# Patient Record
Sex: Male | Born: 1945 | Race: White | Hispanic: No | Marital: Married | Smoking: Former smoker
Health system: Southern US, Community
[De-identification: ages and names within clinical notes are randomized; demographics above are authoritative.]

## PROBLEM LIST (undated history)

## (undated) DIAGNOSIS — I509 Heart failure, unspecified: Secondary | ICD-10-CM

## (undated) DIAGNOSIS — E119 Type 2 diabetes mellitus without complications: Secondary | ICD-10-CM

## (undated) DIAGNOSIS — I639 Cerebral infarction, unspecified: Secondary | ICD-10-CM

## (undated) DIAGNOSIS — D509 Iron deficiency anemia, unspecified: Secondary | ICD-10-CM

## (undated) DIAGNOSIS — J439 Emphysema, unspecified: Secondary | ICD-10-CM

## (undated) DIAGNOSIS — I2510 Atherosclerotic heart disease of native coronary artery without angina pectoris: Secondary | ICD-10-CM

## (undated) DIAGNOSIS — I219 Acute myocardial infarction, unspecified: Secondary | ICD-10-CM

## (undated) DIAGNOSIS — I4891 Unspecified atrial fibrillation: Secondary | ICD-10-CM

## (undated) DIAGNOSIS — K922 Gastrointestinal hemorrhage, unspecified: Secondary | ICD-10-CM

## (undated) DIAGNOSIS — K31811 Angiodysplasia of stomach and duodenum with bleeding: Secondary | ICD-10-CM

## (undated) DIAGNOSIS — K5520 Angiodysplasia of colon without hemorrhage: Secondary | ICD-10-CM

## (undated) HISTORY — PX: COLONOSCOPY W/ POLYPECTOMY: SHX1380

## (undated) HISTORY — PX: PACEMAKER IMPLANT: EP1218

## (undated) HISTORY — PX: OTHER SURGICAL HISTORY: SHX169

## (undated) HISTORY — PX: CARDIAC CATHETERIZATION: SHX172

## (undated) HISTORY — PX: CORONARY STENT PLACEMENT: SHX1402

## (undated) HISTORY — PX: ANGIOPLASTY: SHX39

---

## 2002-03-18 ENCOUNTER — Encounter: Payer: Self-pay | Admitting: Urology

## 2002-03-18 ENCOUNTER — Encounter: Admission: RE | Admit: 2002-03-18 | Discharge: 2002-03-18 | Payer: Self-pay | Admitting: Urology

## 2017-12-11 DIAGNOSIS — J440 Chronic obstructive pulmonary disease with acute lower respiratory infection: Secondary | ICD-10-CM | POA: Diagnosis not present

## 2017-12-11 DIAGNOSIS — J9622 Acute and chronic respiratory failure with hypercapnia: Secondary | ICD-10-CM | POA: Diagnosis not present

## 2017-12-11 DIAGNOSIS — J441 Chronic obstructive pulmonary disease with (acute) exacerbation: Secondary | ICD-10-CM | POA: Diagnosis not present

## 2017-12-11 DIAGNOSIS — J9621 Acute and chronic respiratory failure with hypoxia: Secondary | ICD-10-CM

## 2017-12-11 DIAGNOSIS — E872 Acidosis: Secondary | ICD-10-CM | POA: Diagnosis not present

## 2017-12-11 DIAGNOSIS — A419 Sepsis, unspecified organism: Secondary | ICD-10-CM

## 2017-12-11 DIAGNOSIS — J189 Pneumonia, unspecified organism: Secondary | ICD-10-CM | POA: Diagnosis not present

## 2017-12-12 DIAGNOSIS — R0602 Shortness of breath: Secondary | ICD-10-CM

## 2017-12-12 DIAGNOSIS — J189 Pneumonia, unspecified organism: Secondary | ICD-10-CM | POA: Diagnosis not present

## 2017-12-12 DIAGNOSIS — J441 Chronic obstructive pulmonary disease with (acute) exacerbation: Secondary | ICD-10-CM | POA: Diagnosis not present

## 2017-12-12 DIAGNOSIS — E872 Acidosis: Secondary | ICD-10-CM | POA: Diagnosis not present

## 2017-12-12 DIAGNOSIS — J9621 Acute and chronic respiratory failure with hypoxia: Secondary | ICD-10-CM | POA: Diagnosis not present

## 2017-12-12 DIAGNOSIS — J9622 Acute and chronic respiratory failure with hypercapnia: Secondary | ICD-10-CM | POA: Diagnosis not present

## 2017-12-12 DIAGNOSIS — A419 Sepsis, unspecified organism: Secondary | ICD-10-CM | POA: Diagnosis not present

## 2017-12-13 DIAGNOSIS — J9622 Acute and chronic respiratory failure with hypercapnia: Secondary | ICD-10-CM | POA: Diagnosis not present

## 2017-12-13 DIAGNOSIS — J9621 Acute and chronic respiratory failure with hypoxia: Secondary | ICD-10-CM | POA: Diagnosis not present

## 2017-12-13 DIAGNOSIS — J189 Pneumonia, unspecified organism: Secondary | ICD-10-CM | POA: Diagnosis not present

## 2017-12-13 DIAGNOSIS — A419 Sepsis, unspecified organism: Secondary | ICD-10-CM | POA: Diagnosis not present

## 2017-12-14 DIAGNOSIS — J9621 Acute and chronic respiratory failure with hypoxia: Secondary | ICD-10-CM | POA: Diagnosis not present

## 2017-12-14 DIAGNOSIS — J9622 Acute and chronic respiratory failure with hypercapnia: Secondary | ICD-10-CM | POA: Diagnosis not present

## 2017-12-14 DIAGNOSIS — J189 Pneumonia, unspecified organism: Secondary | ICD-10-CM | POA: Diagnosis not present

## 2017-12-14 DIAGNOSIS — A419 Sepsis, unspecified organism: Secondary | ICD-10-CM | POA: Diagnosis not present

## 2017-12-14 DIAGNOSIS — E872 Acidosis: Secondary | ICD-10-CM | POA: Diagnosis not present

## 2018-09-19 DIAGNOSIS — I4891 Unspecified atrial fibrillation: Secondary | ICD-10-CM

## 2018-09-19 DIAGNOSIS — R079 Chest pain, unspecified: Secondary | ICD-10-CM | POA: Diagnosis not present

## 2018-09-19 DIAGNOSIS — K219 Gastro-esophageal reflux disease without esophagitis: Secondary | ICD-10-CM

## 2018-09-19 DIAGNOSIS — I10 Essential (primary) hypertension: Secondary | ICD-10-CM | POA: Diagnosis not present

## 2018-09-19 DIAGNOSIS — J449 Chronic obstructive pulmonary disease, unspecified: Secondary | ICD-10-CM | POA: Diagnosis not present

## 2018-09-20 DIAGNOSIS — J449 Chronic obstructive pulmonary disease, unspecified: Secondary | ICD-10-CM | POA: Diagnosis not present

## 2018-09-20 DIAGNOSIS — I4891 Unspecified atrial fibrillation: Secondary | ICD-10-CM | POA: Diagnosis not present

## 2018-09-20 DIAGNOSIS — I10 Essential (primary) hypertension: Secondary | ICD-10-CM | POA: Diagnosis not present

## 2018-09-20 DIAGNOSIS — K219 Gastro-esophageal reflux disease without esophagitis: Secondary | ICD-10-CM | POA: Diagnosis not present

## 2018-09-21 DIAGNOSIS — J449 Chronic obstructive pulmonary disease, unspecified: Secondary | ICD-10-CM | POA: Diagnosis not present

## 2018-09-21 DIAGNOSIS — K219 Gastro-esophageal reflux disease without esophagitis: Secondary | ICD-10-CM | POA: Diagnosis not present

## 2018-09-21 DIAGNOSIS — I4891 Unspecified atrial fibrillation: Secondary | ICD-10-CM | POA: Diagnosis not present

## 2018-09-21 DIAGNOSIS — I10 Essential (primary) hypertension: Secondary | ICD-10-CM | POA: Diagnosis not present

## 2019-08-23 DIAGNOSIS — K5520 Angiodysplasia of colon without hemorrhage: Secondary | ICD-10-CM | POA: Diagnosis not present

## 2019-08-23 DIAGNOSIS — I2510 Atherosclerotic heart disease of native coronary artery without angina pectoris: Secondary | ICD-10-CM | POA: Diagnosis not present

## 2019-08-23 DIAGNOSIS — J449 Chronic obstructive pulmonary disease, unspecified: Secondary | ICD-10-CM

## 2019-08-23 DIAGNOSIS — R079 Chest pain, unspecified: Secondary | ICD-10-CM

## 2019-08-23 DIAGNOSIS — K219 Gastro-esophageal reflux disease without esophagitis: Secondary | ICD-10-CM

## 2019-08-23 DIAGNOSIS — I10 Essential (primary) hypertension: Secondary | ICD-10-CM

## 2019-08-24 DIAGNOSIS — I2510 Atherosclerotic heart disease of native coronary artery without angina pectoris: Secondary | ICD-10-CM | POA: Diagnosis not present

## 2019-08-24 DIAGNOSIS — R079 Chest pain, unspecified: Secondary | ICD-10-CM | POA: Diagnosis not present

## 2019-08-24 DIAGNOSIS — J449 Chronic obstructive pulmonary disease, unspecified: Secondary | ICD-10-CM | POA: Diagnosis not present

## 2019-08-24 DIAGNOSIS — K5520 Angiodysplasia of colon without hemorrhage: Secondary | ICD-10-CM | POA: Diagnosis not present

## 2019-12-29 DIAGNOSIS — R079 Chest pain, unspecified: Secondary | ICD-10-CM

## 2019-12-29 DIAGNOSIS — E1169 Type 2 diabetes mellitus with other specified complication: Secondary | ICD-10-CM

## 2019-12-29 DIAGNOSIS — I10 Essential (primary) hypertension: Secondary | ICD-10-CM

## 2019-12-29 DIAGNOSIS — I361 Nonrheumatic tricuspid (valve) insufficiency: Secondary | ICD-10-CM

## 2019-12-29 DIAGNOSIS — J449 Chronic obstructive pulmonary disease, unspecified: Secondary | ICD-10-CM

## 2019-12-29 DIAGNOSIS — I2510 Atherosclerotic heart disease of native coronary artery without angina pectoris: Secondary | ICD-10-CM

## 2019-12-30 DIAGNOSIS — J449 Chronic obstructive pulmonary disease, unspecified: Secondary | ICD-10-CM | POA: Diagnosis not present

## 2019-12-30 DIAGNOSIS — E1169 Type 2 diabetes mellitus with other specified complication: Secondary | ICD-10-CM | POA: Diagnosis not present

## 2019-12-30 DIAGNOSIS — R079 Chest pain, unspecified: Secondary | ICD-10-CM

## 2019-12-30 DIAGNOSIS — I2510 Atherosclerotic heart disease of native coronary artery without angina pectoris: Secondary | ICD-10-CM | POA: Diagnosis not present

## 2020-01-03 DIAGNOSIS — I2510 Atherosclerotic heart disease of native coronary artery without angina pectoris: Secondary | ICD-10-CM | POA: Diagnosis not present

## 2020-01-03 DIAGNOSIS — I480 Paroxysmal atrial fibrillation: Secondary | ICD-10-CM | POA: Diagnosis not present

## 2020-01-03 DIAGNOSIS — I495 Sick sinus syndrome: Secondary | ICD-10-CM | POA: Diagnosis not present

## 2020-01-03 DIAGNOSIS — Z950 Presence of cardiac pacemaker: Secondary | ICD-10-CM | POA: Diagnosis not present

## 2020-01-03 DIAGNOSIS — I959 Hypotension, unspecified: Secondary | ICD-10-CM | POA: Diagnosis not present

## 2020-01-04 MED ORDER — PHENOL EX
40.00 | CUTANEOUS | Status: DC
Start: 2020-01-04 — End: 2020-01-04

## 2020-01-04 MED ORDER — EQUATE NICOTINE 4 MG MT GUM
4.00 | CHEWING_GUM | OROMUCOSAL | Status: DC
Start: ? — End: 2020-01-04

## 2020-01-04 MED ORDER — FOSPHENYTOIN SODIUM 50 MG PE/ML IJ SOLN
15.00 | INTRAMUSCULAR | Status: DC
Start: ? — End: 2020-01-04

## 2020-01-04 MED ORDER — Medication
0.04 | Status: DC
Start: ? — End: 2020-01-04

## 2020-01-04 MED ORDER — LOSARTAN POTASSIUM (ANGIOTENSIN II RECEPTOR ANTAGONISTS)
20.00 | Status: DC
Start: 2020-01-04 — End: 2020-01-04

## 2020-01-04 MED ORDER — Medication
0.00 | Status: DC
Start: ? — End: 2020-01-04

## 2020-01-04 MED ORDER — HCA GLYCERIN (INFANT) 1.5 G RE SUPP
80.00 | RECTAL | Status: DC
Start: 2020-01-04 — End: 2020-01-04

## 2020-01-04 MED ORDER — Medication
1.00 | Status: DC
Start: 2020-01-04 — End: 2020-01-04

## 2020-01-04 MED ORDER — GENERIC EXTERNAL MEDICATION
500.00 | Status: DC
Start: 2020-01-04 — End: 2020-01-04

## 2020-01-04 MED ORDER — COMPOUND W FREEZE OFF EX AERO
0.00 | INHALATION_SPRAY | CUTANEOUS | Status: DC
Start: 2020-01-04 — End: 2020-01-04

## 2020-01-04 MED ORDER — IPECAC PO
12.00 | ORAL | Status: DC
Start: ? — End: 2020-01-04

## 2020-01-04 MED ORDER — STRI-DEX MAXIMUM STRENGTH 2 % EX PADS
125.00 | MEDICATED_PAD | CUTANEOUS | Status: DC
Start: ? — End: 2020-01-04

## 2020-01-04 MED ORDER — AMPHOTER B CHOLEST SULF CMPLX
81.00 | Status: DC
Start: 2020-01-06 — End: 2020-01-04

## 2020-01-04 MED ORDER — BUDESONIDE 0.5 MG/2ML IN SUSP
0.50 | RESPIRATORY_TRACT | Status: DC
Start: 2020-01-05 — End: 2020-01-04

## 2020-01-04 MED ORDER — ARMOUR THYROID 30 MG PO TABS
4.00 | ORAL_TABLET | ORAL | Status: DC
Start: 2020-01-04 — End: 2020-01-04

## 2020-01-04 MED ORDER — PANCOF HC 15-2-3 MG/5ML OR SYRP
1.00 | ORAL_SOLUTION | ORAL | Status: DC
Start: ? — End: 2020-01-04

## 2020-01-04 MED ORDER — IPECAC PO
60.00 | ORAL | Status: DC
Start: ? — End: 2020-01-04

## 2020-01-04 MED ORDER — EQL SHEER SPOTS SMALL MISC
0.00 | Status: DC
Start: ? — End: 2020-01-04

## 2020-01-04 MED ORDER — MONTELUKAST SODIUM 10 MG PO TABS
10.00 | ORAL_TABLET | ORAL | Status: DC
Start: 2020-01-09 — End: 2020-01-04

## 2020-01-04 MED ORDER — CALCIUM-VITAMIN D-VITAMIN K 600-1000-90 MG-UNT-MCG PO TABS
25.00 | ORAL_TABLET | ORAL | Status: DC
Start: ? — End: 2020-01-04

## 2020-01-04 MED ORDER — IPECAC PO
30.00 | ORAL | Status: DC
Start: ? — End: 2020-01-04

## 2020-01-05 MED ORDER — PANTOPRAZOLE SODIUM 40 MG PO TBEC
40.00 | DELAYED_RELEASE_TABLET | ORAL | Status: DC
Start: 2020-01-10 — End: 2020-01-05

## 2020-01-05 MED ORDER — HYDRALAZINE HCL 20 MG/ML IJ SOLN
10.00 | INTRAMUSCULAR | Status: DC
Start: ? — End: 2020-01-05

## 2020-01-05 MED ORDER — ACCU-PRO PUMP SET/VENT MISC
2.50 | Status: DC
Start: 2020-01-05 — End: 2020-01-05

## 2020-01-05 MED ORDER — ALBUTEROL SULFATE HFA 108 (90 BASE) MCG/ACT IN AERS
4.00 | INHALATION_SPRAY | RESPIRATORY_TRACT | Status: DC
Start: ? — End: 2020-01-05

## 2020-01-05 MED ORDER — CVS KIDPANT BOYS X-LARGE MISC
80.00 | Status: DC
Start: 2020-01-05 — End: 2020-01-05

## 2020-01-10 MED ORDER — UMECLIDINIUM BROMIDE 62.5 MCG/INH IN AEPB
1.00 | INHALATION_SPRAY | RESPIRATORY_TRACT | Status: DC
Start: 2020-01-10 — End: 2020-01-10

## 2020-01-10 MED ORDER — FLUTICASONE FUROATE-VILANTEROL 200-25 MCG/INH IN AEPB
1.00 | INHALATION_SPRAY | RESPIRATORY_TRACT | Status: DC
Start: 2020-01-10 — End: 2020-01-10

## 2020-01-10 MED ORDER — PREDNISONE 20 MG PO TABS
40.00 | ORAL_TABLET | ORAL | Status: DC
Start: 2020-01-10 — End: 2020-01-10

## 2020-01-10 MED ORDER — GUAIFENESIN ER 600 MG PO TB12
600.00 | ORAL_TABLET | ORAL | Status: DC
Start: 2020-01-09 — End: 2020-01-10

## 2020-01-10 MED ORDER — HEPARIN SODIUM (PORCINE) 5000 UNIT/ML IJ SOLN
5000.00 | INTRAMUSCULAR | Status: DC
Start: 2020-01-09 — End: 2020-01-10

## 2020-01-10 MED ORDER — LISINOPRIL 5 MG PO TABS
2.50 | ORAL_TABLET | ORAL | Status: DC
Start: 2020-01-10 — End: 2020-01-10

## 2020-01-10 MED ORDER — ACETAMINOPHEN 325 MG PO TABS
650.00 | ORAL_TABLET | ORAL | Status: DC
Start: ? — End: 2020-01-10

## 2020-01-10 MED ORDER — ALBUTEROL SULFATE (2.5 MG/3ML) 0.083% IN NEBU
2.50 | INHALATION_SOLUTION | RESPIRATORY_TRACT | Status: DC
Start: ? — End: 2020-01-10

## 2020-01-10 MED ORDER — INSULIN LISPRO 100 UNIT/ML ~~LOC~~ SOLN
0.00 | SUBCUTANEOUS | Status: DC
Start: 2020-01-09 — End: 2020-01-10

## 2020-01-10 MED ORDER — METOPROLOL TARTRATE 25 MG PO TABS
25.00 | ORAL_TABLET | ORAL | Status: DC
Start: 2020-01-09 — End: 2020-01-10

## 2020-02-24 ENCOUNTER — Inpatient Hospital Stay (HOSPITAL_COMMUNITY)
Admission: EM | Admit: 2020-02-24 | Discharge: 2020-03-05 | DRG: 377 | Disposition: A | Discharge status: Home Health | Payer: Medicare HMO | Source: Other Acute Inpatient Hospital | Attending: Internal Medicine | Admitting: Internal Medicine

## 2020-02-24 ENCOUNTER — Encounter (HOSPITAL_COMMUNITY): Payer: Self-pay | Admitting: Internal Medicine

## 2020-02-24 DIAGNOSIS — K5520 Angiodysplasia of colon without hemorrhage: Secondary | ICD-10-CM | POA: Diagnosis not present

## 2020-02-24 DIAGNOSIS — J439 Emphysema, unspecified: Secondary | ICD-10-CM | POA: Diagnosis not present

## 2020-02-24 DIAGNOSIS — Z8673 Personal history of transient ischemic attack (TIA), and cerebral infarction without residual deficits: Secondary | ICD-10-CM | POA: Diagnosis not present

## 2020-02-24 DIAGNOSIS — N1831 Chronic kidney disease, stage 3a: Secondary | ICD-10-CM | POA: Diagnosis present

## 2020-02-24 DIAGNOSIS — I2510 Atherosclerotic heart disease of native coronary artery without angina pectoris: Secondary | ICD-10-CM | POA: Diagnosis present

## 2020-02-24 DIAGNOSIS — Z9911 Dependence on respirator [ventilator] status: Secondary | ICD-10-CM | POA: Diagnosis not present

## 2020-02-24 DIAGNOSIS — Z823 Family history of stroke: Secondary | ICD-10-CM

## 2020-02-24 DIAGNOSIS — Z9981 Dependence on supplemental oxygen: Secondary | ICD-10-CM

## 2020-02-24 DIAGNOSIS — K31811 Angiodysplasia of stomach and duodenum with bleeding: Secondary | ICD-10-CM | POA: Diagnosis present

## 2020-02-24 DIAGNOSIS — Z800 Family history of malignant neoplasm of digestive organs: Secondary | ICD-10-CM

## 2020-02-24 DIAGNOSIS — Z950 Presence of cardiac pacemaker: Secondary | ICD-10-CM

## 2020-02-24 DIAGNOSIS — Z8249 Family history of ischemic heart disease and other diseases of the circulatory system: Secondary | ICD-10-CM

## 2020-02-24 DIAGNOSIS — I480 Paroxysmal atrial fibrillation: Secondary | ICD-10-CM | POA: Diagnosis present

## 2020-02-24 DIAGNOSIS — K219 Gastro-esophageal reflux disease without esophagitis: Secondary | ICD-10-CM | POA: Diagnosis present

## 2020-02-24 DIAGNOSIS — K921 Melena: Secondary | ICD-10-CM | POA: Diagnosis present

## 2020-02-24 DIAGNOSIS — K5521 Angiodysplasia of colon with hemorrhage: Secondary | ICD-10-CM | POA: Diagnosis present

## 2020-02-24 DIAGNOSIS — R54 Age-related physical debility: Secondary | ICD-10-CM | POA: Diagnosis present

## 2020-02-24 DIAGNOSIS — J9612 Chronic respiratory failure with hypercapnia: Secondary | ICD-10-CM | POA: Diagnosis present

## 2020-02-24 DIAGNOSIS — E872 Acidosis: Secondary | ICD-10-CM | POA: Diagnosis present

## 2020-02-24 DIAGNOSIS — D62 Acute posthemorrhagic anemia: Secondary | ICD-10-CM | POA: Diagnosis present

## 2020-02-24 DIAGNOSIS — J9622 Acute and chronic respiratory failure with hypercapnia: Secondary | ICD-10-CM | POA: Diagnosis not present

## 2020-02-24 DIAGNOSIS — K922 Gastrointestinal hemorrhage, unspecified: Secondary | ICD-10-CM | POA: Diagnosis present

## 2020-02-24 DIAGNOSIS — J9621 Acute and chronic respiratory failure with hypoxia: Secondary | ICD-10-CM | POA: Diagnosis not present

## 2020-02-24 DIAGNOSIS — J9690 Respiratory failure, unspecified, unspecified whether with hypoxia or hypercapnia: Secondary | ICD-10-CM

## 2020-02-24 DIAGNOSIS — Z888 Allergy status to other drugs, medicaments and biological substances status: Secondary | ICD-10-CM | POA: Diagnosis not present

## 2020-02-24 DIAGNOSIS — D123 Benign neoplasm of transverse colon: Secondary | ICD-10-CM | POA: Diagnosis present

## 2020-02-24 DIAGNOSIS — R338 Other retention of urine: Secondary | ICD-10-CM | POA: Diagnosis not present

## 2020-02-24 DIAGNOSIS — D649 Anemia, unspecified: Secondary | ICD-10-CM | POA: Diagnosis not present

## 2020-02-24 DIAGNOSIS — I252 Old myocardial infarction: Secondary | ICD-10-CM

## 2020-02-24 DIAGNOSIS — N401 Enlarged prostate with lower urinary tract symptoms: Secondary | ICD-10-CM | POA: Diagnosis not present

## 2020-02-24 DIAGNOSIS — Z87891 Personal history of nicotine dependence: Secondary | ICD-10-CM

## 2020-02-24 DIAGNOSIS — J9611 Chronic respiratory failure with hypoxia: Secondary | ICD-10-CM | POA: Diagnosis present

## 2020-02-24 DIAGNOSIS — I495 Sick sinus syndrome: Secondary | ICD-10-CM | POA: Diagnosis present

## 2020-02-24 DIAGNOSIS — Z7901 Long term (current) use of anticoagulants: Secondary | ICD-10-CM

## 2020-02-24 DIAGNOSIS — E611 Iron deficiency: Secondary | ICD-10-CM | POA: Diagnosis not present

## 2020-02-24 DIAGNOSIS — E1122 Type 2 diabetes mellitus with diabetic chronic kidney disease: Secondary | ICD-10-CM | POA: Diagnosis present

## 2020-02-24 DIAGNOSIS — E119 Type 2 diabetes mellitus without complications: Secondary | ICD-10-CM | POA: Diagnosis not present

## 2020-02-24 DIAGNOSIS — J449 Chronic obstructive pulmonary disease, unspecified: Secondary | ICD-10-CM | POA: Diagnosis present

## 2020-02-24 DIAGNOSIS — J9601 Acute respiratory failure with hypoxia: Secondary | ICD-10-CM | POA: Diagnosis not present

## 2020-02-24 DIAGNOSIS — T182XXA Foreign body in stomach, initial encounter: Secondary | ICD-10-CM

## 2020-02-24 DIAGNOSIS — J9602 Acute respiratory failure with hypercapnia: Secondary | ICD-10-CM | POA: Diagnosis not present

## 2020-02-24 DIAGNOSIS — J441 Chronic obstructive pulmonary disease with (acute) exacerbation: Secondary | ICD-10-CM | POA: Diagnosis not present

## 2020-02-24 DIAGNOSIS — Z955 Presence of coronary angioplasty implant and graft: Secondary | ICD-10-CM

## 2020-02-24 HISTORY — DX: Gastrointestinal hemorrhage, unspecified: K92.2

## 2020-02-24 HISTORY — DX: Angiodysplasia of colon without hemorrhage: K55.20

## 2020-02-24 HISTORY — DX: Emphysema, unspecified: J43.9

## 2020-02-24 HISTORY — DX: Angiodysplasia of stomach and duodenum with bleeding: K31.811

## 2020-02-24 HISTORY — DX: Unspecified atrial fibrillation: I48.91

## 2020-02-24 HISTORY — DX: Atherosclerotic heart disease of native coronary artery without angina pectoris: I25.10

## 2020-02-24 HISTORY — DX: Heart failure, unspecified: I50.9

## 2020-02-24 HISTORY — DX: Iron deficiency anemia, unspecified: D50.9

## 2020-02-24 HISTORY — DX: Cerebral infarction, unspecified: I63.9

## 2020-02-24 HISTORY — DX: Type 2 diabetes mellitus without complications: E11.9

## 2020-02-24 HISTORY — DX: Acute myocardial infarction, unspecified: I21.9

## 2020-02-24 LAB — COMPREHENSIVE METABOLIC PANEL
ALT: 10 U/L (ref 0–44)
AST: 14 U/L — ABNORMAL LOW (ref 15–41)
Albumin: 3.3 g/dL — ABNORMAL LOW (ref 3.5–5.0)
Alkaline Phosphatase: 43 U/L (ref 38–126)
Anion gap: 8 (ref 5–15)
BUN: 24 mg/dL — ABNORMAL HIGH (ref 8–23)
CO2: 26 mmol/L (ref 22–32)
Calcium: 8.7 mg/dL — ABNORMAL LOW (ref 8.9–10.3)
Chloride: 104 mmol/L (ref 98–111)
Creatinine, Ser: 1.2 mg/dL (ref 0.61–1.24)
GFR calc Af Amer: >60 mL/min (ref >60)
GFR calc non Af Amer: 60 mL/min — ABNORMAL LOW (ref >60)
Glucose, Bld: 91 mg/dL (ref 70–99)
Potassium: 4.2 mmol/L (ref 3.5–5.1)
Sodium: 138 mmol/L (ref 135–145)
Total Bilirubin: 0.8 mg/dL (ref 0.3–1.2)
Total Protein: 5.7 g/dL — ABNORMAL LOW (ref 6.5–8.1)

## 2020-02-24 LAB — CBC
HCT: 25.4 % — ABNORMAL LOW (ref 39.0–52.0)
Hemoglobin: 7.6 g/dL — ABNORMAL LOW (ref 13.0–17.0)
MCH: 27.7 pg (ref 26.0–34.0)
MCHC: 29.9 g/dL — ABNORMAL LOW (ref 30.0–36.0)
MCV: 92.7 fL (ref 80.0–100.0)
Platelets: 192 10*3/uL (ref 150–400)
RBC: 2.74 MIL/uL — ABNORMAL LOW (ref 4.22–5.81)
RDW: 14.8 % (ref 11.5–15.5)
WBC: 4.5 10*3/uL (ref 4.0–10.5)
nRBC: 0 % (ref 0.0–0.2)

## 2020-02-24 LAB — HEMOGLOBIN A1C
Hgb A1c MFr Bld: 5.8 % — ABNORMAL HIGH (ref 4.8–5.6)
Mean Plasma Glucose: 119.76 mg/dL

## 2020-02-24 LAB — TYPE AND SCREEN
ABO/RH(D): O POS
Antibody Screen: NEGATIVE

## 2020-02-24 LAB — ABO/RH: ABO/RH(D): O POS

## 2020-02-24 LAB — GLUCOSE, CAPILLARY: Glucose-Capillary: 87 mg/dL (ref 70–99)

## 2020-02-24 MED ORDER — PANTOPRAZOLE SODIUM 40 MG IV SOLR
40.0000 mg | Freq: Two times a day (BID) | INTRAVENOUS | Status: DC
  Administered 2020-02-27 – 2020-03-01 (×6): 40 mg via INTRAVENOUS
  Filled 2020-02-24 (×6): qty 40

## 2020-02-24 MED ORDER — AMIODARONE HCL 200 MG PO TABS
200.0000 mg | ORAL_TABLET | Freq: Two times a day (BID) | ORAL | Status: DC
  Administered 2020-02-24 – 2020-03-01 (×12): 200 mg via ORAL
  Filled 2020-02-24 (×12): qty 1

## 2020-02-24 MED ORDER — ONDANSETRON HCL 4 MG/2ML IJ SOLN: 4.0000 mg | Freq: Four times a day (QID) | INTRAMUSCULAR | Status: DC | PRN

## 2020-02-24 MED ORDER — SODIUM CHLORIDE 0.9 % IV SOLN
8.0000 mg/h | INTRAVENOUS | Status: AC
  Administered 2020-02-24 – 2020-02-27 (×4): 8 mg/h via INTRAVENOUS
  Filled 2020-02-24 (×5): qty 80

## 2020-02-24 MED ORDER — ONDANSETRON HCL 4 MG PO TABS: 4.0000 mg | ORAL_TABLET | Freq: Four times a day (QID) | ORAL | Status: DC | PRN

## 2020-02-24 MED ORDER — DILTIAZEM HCL ER COATED BEADS 240 MG PO CP24
240.0000 mg | ORAL_CAPSULE | Freq: Every day | ORAL | Status: DC
  Administered 2020-02-25 – 2020-02-29 (×5): 240 mg via ORAL
  Filled 2020-02-24: qty 2
  Filled 2020-02-24 (×2): qty 1
  Filled 2020-02-24 (×3): qty 2

## 2020-02-24 MED ORDER — METOPROLOL TARTRATE 12.5 MG HALF TABLET
50.0000 mg | ORAL_TABLET | Freq: Two times a day (BID) | ORAL | Status: DC
  Administered 2020-02-24 – 2020-03-01 (×12): 50 mg via ORAL
  Filled 2020-02-24: qty 1
  Filled 2020-02-24 (×2): qty 4
  Filled 2020-02-24 (×2): qty 1
  Filled 2020-02-24: qty 4
  Filled 2020-02-24 (×6): qty 1

## 2020-02-24 MED ORDER — ACETAMINOPHEN 650 MG RE SUPP: 650.0000 mg | Freq: Four times a day (QID) | RECTAL | Status: DC | PRN

## 2020-02-24 MED ORDER — INSULIN ASPART 100 UNIT/ML ~~LOC~~ SOLN
0.0000 [IU] | SUBCUTANEOUS | Status: DC
  Administered 2020-02-25: 1 [IU] via SUBCUTANEOUS
  Administered 2020-02-26: 2 [IU] via SUBCUTANEOUS
  Administered 2020-02-28: 1 [IU] via SUBCUTANEOUS
  Administered 2020-02-28: 2 [IU] via SUBCUTANEOUS
  Administered 2020-02-28 – 2020-03-03 (×7): 1 [IU] via SUBCUTANEOUS
  Administered 2020-03-04: 2 [IU] via SUBCUTANEOUS
  Administered 2020-03-04: 1 [IU] via SUBCUTANEOUS

## 2020-02-24 MED ORDER — ACETAMINOPHEN 325 MG PO TABS: 650.0000 mg | ORAL_TABLET | Freq: Four times a day (QID) | ORAL | Status: DC | PRN

## 2020-02-24 NOTE — H&P (Signed)
History and Physical    CORD WILCZYNSKI GQQ:[id number] DOB: Dec 24, 1945 DOA: 02/24/2020  PCP: Bonnita Nasuti, MD  Patient coming from: [place] transfer  I have personally briefly reviewed patient's old medical records in [place]  Chief Complaint: GIB, melena, anemia  HPI: Devin Holt is a 74 y.o. male with medical history significant of PAF, remote stroke, recently started on eliquis last month at [facility], DM2, COPD on nocturnal O2, CAD with prior stent, SSS s/p PPM.  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  Last EGD Jan 2020: cautery of small bowel AVMs.  Last colonoscopy Aug 2019.  These done in [place] by Dr. Lyda Jester.  Pt recently hospitalized at [facility] just last month for COPD exacerbation and a.fib RVR.  Started on eliquis during hospital stay.  Pt developing generalized weakness over past couple of weeks.  Has melanotic stool, though he didn't think the dark stool unusual for him given h/o AVMs and PO iron treatment.  Went to PCP, HGB 5.5, sent in to ED at [facility], HGB 5.6.  2u PRBC transfusion and PPI bolus.  Transferred to [facility]   Review of Systems: As per HPI, otherwise all review of systems negative.  Past Medical History:  Diagnosis Date  . A-fib ([place])   . Angiodysplasia of stomach and duodenum with bleeding   . CAD (coronary artery disease)    LHC 01/07/20 - no significant stenosis  . CHF (congestive heart failure) (HCC)    EF - 55-60 as of May 2021 [facility]  . CVA (cerebral vascular accident) ([place])   . DM2 (diabetes mellitus, type 2) ([place])   . Emphysema of lung ([place])    Seen on CT  . GI bleed   . Iron deficiency anemia   . Myocardial infarct [facility])     Past Surgical History:  Procedure Laterality Date  . ANGIOPLASTY    . CARDIAC CATHETERIZATION    . COLONOSCOPY W/ POLYPECTOMY    . CORONARY STENT PLACEMENT     3-4  . OTHER SURGICAL HISTORY     2 aortic stents     reports that he has quit  smoking. He does not have any smokeless tobacco history on file. He reports that he does not drink alcohol and does not use drugs.  Allergies  Allergen Reactions  . Isosorbide     "Made me feel weak"    Family History  Problem Relation Age of Onset  . Colon cancer Mother   . Hypertension Father   . Stroke Father   . Colon cancer Sister      Prior to Admission medications   Not on File    Physical Exam: Vitals:   02/24/20 2028  BP: (!) 144/61  Pulse: 78  Resp: 16  Temp: 98.2 F (36.8 C)  TempSrc: Oral  SpO2: 100%  Weight: 74 kg    Constitutional: NAD, calm, comfortable Eyes: PERRL, lids and conjunctivae normal ENMT: Mucous membranes are moist. Posterior pharynx clear of any exudate or lesions.Normal dentition.  Neck: normal, supple, no masses, no thyromegaly Respiratory: clear to auscultation bilaterally, no wheezing, no crackles. Normal respiratory effort. No accessory muscle use.  Cardiovascular: Regular rate and rhythm, no murmurs / rubs / gallops. No extremity edema. 2+ pedal pulses. No carotid bruits.  Abdomen: no tenderness, no masses palpated. No hepatosplenomegaly. Bowel sounds positive.  Musculoskeletal: no clubbing / cyanosis. No joint deformity upper and lower extremities. Good ROM,  no contractures. Normal muscle tone.  Skin: no rashes, lesions, ulcers. No induration Neurologic: CN 2-12 grossly intact. Sensation intact, DTR normal. Strength 5/5 in all 4.  Psychiatric: Normal judgment and insight. Alert and oriented x 3. Normal mood.    Labs on Admission: I have personally reviewed following labs and imaging studies  CBC: No results for input(s): WBC, NEUTROABS, HGB, HCT, MCV, PLT in the last 168 hours. Basic Metabolic Panel: No results for input(s): NA, K, CL, CO2, GLUCOSE, BUN, CREATININE, CALCIUM, MG, PHOS in the last 168 hours. GFR: CrCl cannot be calculated (No successful lab value found.). Liver Function Tests: No results for input(s): AST, ALT,  ALKPHOS, BILITOT, PROT, ALBUMIN in the last 168 hours. No results for input(s): LIPASE, AMYLASE in the last 168 hours. No results for input(s): AMMONIA in the last 168 hours. Coagulation Profile: No results for input(s): INR, PROTIME in the last 168 hours. Cardiac Enzymes: No results for input(s): CKTOTAL, CKMB, CKMBINDEX, TROPONINI in the last 168 hours. BNP (last 3 results) No results for input(s): PROBNP in the last 8760 hours. HbA1C: No results for input(s): HGBA1C in the last 72 hours. CBG: Recent Labs  Lab 02/24/20 2044  GLUCAP 87   Lipid Profile: No results for input(s): CHOL, HDL, LDLCALC, TRIG, CHOLHDL, LDLDIRECT in the last 72 hours. Thyroid Function Tests: No results for input(s): TSH, T4TOTAL, FREET4, T3FREE, THYROIDAB in the last 72 hours. Anemia Panel: No results for input(s): VITAMINB12, FOLATE, FERRITIN, TIBC, IRON, RETICCTPCT in the last 72 hours. Urine analysis: No results found for: COLORURINE, APPEARANCEUR, LABSPEC, PHURINE, GLUCOSEU, HGBUR, BILIRUBINUR, KETONESUR, PROTEINUR, UROBILINOGEN, NITRITE, LEUKOCYTESUR  Radiological Exams on Admission: No results found.  EKG: Independently reviewed.  Assessment/Plan Principal Problem:   Gastrointestinal hemorrhage with melena Active Problems:   PAF (paroxysmal atrial fibrillation) (HCC)   CAD (coronary artery disease)   COPD (chronic obstructive pulmonary disease) (HCC)   Chronic respiratory failure with hypoxia and hypercapnia (HCC)   DM2 (diabetes mellitus, type 2) (HCC)   Acute blood loss anemia    1. GIB with melena and acute blood loss anemia on top of chronic iron deficiency anemia - 1. 2u PRBC already transfused for HGB of 5.6 at [facility] 2. Checking repeat CBC now and again in AM 3. Eliquis stopped - last dose was AM of 02/23/20 - so well over 24h since last dose, no indication for reversal agent at this point. 4. Type and screen 5. Transfuse further PRBCs if needed 6. Last BM was just after arrival to  [facility] 7. Tele monitor 8. Hemodynamically stable at the moment 9. Clear liquid diet 10. NPO after MN 11. PPI gtt 12. Call GI in AM - presumably needs scope(s) 2. PAF - 1. Cont metoprolol 2. Cont cardizem 3. Cont amiodarone 4. Stop eliquis 5. Consult cards after issue #1 above resolved: 1. Given extensive history of prior GIBs, but also h/o CVA(s): Is this patient a candidate for a Watchman's procedure? 3. COPD - 1. Cont home O2 at night 2. Cont home meds when med rec completed 4. CAD - 1. Cont statin when med rec completed 2. No significant stenosis on LHC in April 2021 3. Preserved EF as of April 2021 5. DM2 - 1. Hold metformin 2. Sensitive SSI Q4H  DVT prophylaxis: SCDs Code Status: Full Family Communication: No family in room Disposition Plan: Home after bleeding stopped Consults called: None, Call GI in AM Admission status: Admit to inpatient  Severity of Illness: The appropriate patient status for this patient  is INPATIENT. Inpatient status is judged to be reasonable and necessary in order to provide the required intensity of service to ensure the patient's safety. The patient's presenting symptoms, physical exam findings, and initial radiographic and laboratory data in the context of their chronic comorbidities is felt to place them at high risk for further clinical deterioration. Furthermore, it is not anticipated that the patient will be medically stable for discharge from the hospital within 2 midnights of admission. The following factors support the patient status of inpatient.   IP status due to GIB with HGB 5.6, requiring transfusions.  * I certify that at the point of admission it is my clinical judgment that the patient will require inpatient hospital care spanning beyond 2 midnights from the point of admission due to high intensity of service, high risk for further deterioration and high frequency of surveillance required.*    Deniesha Stenglein M. DO [facility]  How to contact the [facility] Attending or Consulting provider [place] or covering provider during after hours [place], for this patient?  1. Check the care team in [facility] and look for a) attending/consulting [facility] provider listed and b) the [facility] team listed 2. Log into [email or web address]  Amion Physician Scheduling and messaging for groups and whole hospitals  On call and physician scheduling software for group practices, residents, hospitalists and other medical providers for call, clinic, rotation and shift schedules. OnCall Enterprise is a hospital-wide system for scheduling doctors and paging doctors on call. EasyPlot is for scientific plotting and data analysis.  [email or web address]  and use [place]'s universal password to access. If you do not have the password, please contact the hospital operator.  3. Locate the [facility] provider you are looking for under [facility] and page to a number that you can be directly reached. 4. If you still have difficulty reaching the provider, please page the [facility] (Director on Call) for the Hospitalists listed on amion for assistance.  02/24/2020, 9:32 PM

## 2020-02-25 ENCOUNTER — Encounter (HOSPITAL_COMMUNITY): Payer: Self-pay | Admitting: Internal Medicine

## 2020-02-25 ENCOUNTER — Other Ambulatory Visit: Payer: Self-pay

## 2020-02-25 DIAGNOSIS — K31811 Angiodysplasia of stomach and duodenum with bleeding: Secondary | ICD-10-CM | POA: Diagnosis present

## 2020-02-25 DIAGNOSIS — K5520 Angiodysplasia of colon without hemorrhage: Secondary | ICD-10-CM

## 2020-02-25 LAB — CBC
HCT: 25.4 % — ABNORMAL LOW (ref 39.0–52.0)
HCT: 27.1 % — ABNORMAL LOW (ref 39.0–52.0)
Hemoglobin: 7.7 g/dL — ABNORMAL LOW (ref 13.0–17.0)
Hemoglobin: 8.2 g/dL — ABNORMAL LOW (ref 13.0–17.0)
MCH: 28 pg (ref 26.0–34.0)
MCH: 27.8 pg (ref 26.0–34.0)
MCHC: 30.3 g/dL (ref 30.0–36.0)
MCHC: 30.3 g/dL (ref 30.0–36.0)
MCV: 92.4 fL (ref 80.0–100.0)
MCV: 91.9 fL (ref 80.0–100.0)
Platelets: 219 10*3/uL (ref 150–400)
Platelets: 236 10*3/uL (ref 150–400)
RBC: 2.75 MIL/uL — ABNORMAL LOW (ref 4.22–5.81)
RBC: 2.95 MIL/uL — ABNORMAL LOW (ref 4.22–5.81)
RDW: 14.9 % (ref 11.5–15.5)
RDW: 15 % (ref 11.5–15.5)
WBC: 4.6 10*3/uL (ref 4.0–10.5)
WBC: 5.6 10*3/uL (ref 4.0–10.5)
nRBC: 0 % (ref 0.0–0.2)
nRBC: 0 % (ref 0.0–0.2)

## 2020-02-25 LAB — GLUCOSE, CAPILLARY
Glucose-Capillary: 126 mg/dL — ABNORMAL HIGH (ref 70–99)
Glucose-Capillary: 80 mg/dL (ref 70–99)
Glucose-Capillary: 88 mg/dL (ref 70–99)
Glucose-Capillary: 91 mg/dL (ref 70–99)
Glucose-Capillary: 92 mg/dL (ref 70–99)
Glucose-Capillary: 99 mg/dL (ref 70–99)

## 2020-02-25 LAB — BASIC METABOLIC PANEL
Anion gap: 5 (ref 5–15)
BUN: 21 mg/dL (ref 8–23)
CO2: 27 mmol/L (ref 22–32)
Calcium: 8.8 mg/dL — ABNORMAL LOW (ref 8.9–10.3)
Chloride: 104 mmol/L (ref 98–111)
Creatinine, Ser: 1.2 mg/dL (ref 0.61–1.24)
GFR calc Af Amer: >60 mL/min (ref >60)
GFR calc non Af Amer: 60 mL/min — ABNORMAL LOW (ref >60)
Glucose, Bld: 97 mg/dL (ref 70–99)
Potassium: 4.2 mmol/L (ref 3.5–5.1)
Sodium: 136 mmol/L (ref 135–145)

## 2020-02-25 MED ORDER — LEVALBUTEROL HCL 1.25 MG/0.5ML IN NEBU
1.2500 mg | INHALATION_SOLUTION | Freq: Three times a day (TID) | RESPIRATORY_TRACT | Status: DC
  Administered 2020-02-26: 1.25 mg via RESPIRATORY_TRACT
  Filled 2020-02-25: qty 0.5

## 2020-02-25 MED ORDER — LEVALBUTEROL HCL 1.25 MG/0.5ML IN NEBU
1.2500 mg | INHALATION_SOLUTION | Freq: Three times a day (TID) | RESPIRATORY_TRACT | Status: DC
  Administered 2020-02-25 (×3): 1.25 mg via RESPIRATORY_TRACT
  Filled 2020-02-25 (×3): qty 0.5

## 2020-02-25 MED ORDER — SODIUM CHLORIDE 0.9 % IV SOLN: INTRAVENOUS | Status: DC

## 2020-02-25 NOTE — Progress Notes (Signed)
New Admission Note:  Arrival Method: Stretcher from [place] link Mental Orientation: A&Ox4 Telemetry: Box # 10 Assessment: Completed Skin: Intact IV: PIV Bilateral AC Pain: 0/10 Tubes: None Safety Measures: Safety Fall Prevention Plan was discussed  Admission: Initiated Belongings: Clothing in bag at bedside Unit Orientation: Patient has been orientated to the room, unit, and staff.  [facility] has been paged. Call light has been placed within reach and bed alarm has been activated. Will continue to monitor the patient.  Percell Boston, RN

## 2020-02-25 NOTE — Consult Note (Signed)
Referring Provider: Dyanne Carrel, NP Primary Care Physician:  Bonnita Nasuti, MD Primary Gastroenterologist:  Althia Forts  Reason for Consultation:  Upper GI bleeding  HPI: Devin Holt is a 74 y.o. male with history of A fib (on Eliquis and aspirin), CAD, CHF(EF 55-60% as of 01/2020), sick sinus syndrome (s/p pacemaker) and GI bleeding (AVMs) presenting for consultation of upper GI bleeding.  Patient states he was hospitalized in May for COPD exacerbation, and experienced A fib with RVR, which led to resumption of Eliquis.   Patient reports he was discharged on 5/28 and started experiencing melena a few days later.  He has continued to have melena daily since that time.  He typically has 1-2 bowel movements per day.  He reports all bowel movements are black, but some are soft and some are formed.  He denies any hematochezia.  He further reports that he has had early satiety since the beginning of June.  Patient experienced weakness.  Had labs with PCP (Hgb 4.6) and then presented to outside hospital. On arrival to [facility] yesterday, Hgb 5.5  He received 2u pRBCs.  Patient denies any abdominal pain, nausea, vomiting, hematemesis, dysphagia, heartburn, unexplained weight loss.  Does report some increased flatulence.  Denies NSAID use.  Patient has a history of AVMs (?duodenal and colonic).  Attempted to call Dr. Crisoforo Oxford office ([facility]) for records but the office was closed today.  Last EGD was January 2020 with fulguration of AVMs in the small bowel.  Last colonoscopy was August 2019.   Past Medical History:  Diagnosis Date  . A-fib ([place])   . Angiodysplasia of stomach and duodenum with bleeding   . AVM (arteriovenous malformation) of colon   . CAD (coronary artery disease)    LHC 01/07/20 - no significant stenosis  . CHF (congestive heart failure) (HCC)    EF - 55-60 as of May 2021 [facility]  . CVA (cerebral vascular accident) ([place])   . DM2 (diabetes mellitus, type 2) ([place])    . Emphysema of lung ([place])    Seen on CT  . GI bleed   . Iron deficiency anemia   . Myocardial infarct [facility])     Past Surgical History:  Procedure Laterality Date  . ANGIOPLASTY    . CARDIAC CATHETERIZATION    . COLONOSCOPY W/ POLYPECTOMY    . CORONARY STENT PLACEMENT     3-4  . OTHER SURGICAL HISTORY     2 aortic stents  . PACEMAKER IMPLANT      Prior to Admission medications   Medication Sig Start Date End Date Taking? Authorizing Provider  aspirin 81 MG EC tablet Take 81 mg by mouth daily. 02/08/20 03/09/20 Yes [provider]  albuterol (VENTOLIN HFA) 108 (90 Base) MCG/ACT inhaler Inhale 1 puff into the lungs every 4 (four) hours as needed for shortness of breath or wheezing. 01/28/20   [provider]  diltiazem (CARDIZEM CD) 240 MG 24 hr capsule Take 240 mg by mouth daily. 02/07/20   [provider]  dipyridamole-aspirin (AGGRENOX) 200-25 MG 12hr capsule Take 1 capsule by mouth 2 (two) times daily. 01/17/20   [provider]  ELIQUIS 5 MG TABS tablet Take 5 mg by mouth 2 (two) times daily. 02/07/20   [provider]  famotidine (PEPCID) 20 MG tablet Take 20 mg by mouth 2 (two) times daily. 02/08/20   [provider]  fenofibrate 54 MG tablet Take 54 mg by mouth daily. 02/07/20   [provider]  levalbuterol (XOPENEX) 1.25 MG/3ML nebulizer solution Take 1.25 mg by nebulization every 8 (eight) hours. 02/01/20   [provider]  metFORMIN (GLUCOPHAGE) 500 MG tablet Take 500 mg by mouth daily. 02/23/20   [provider]  metoprolol tartrate (LOPRESSOR) 50 MG tablet Take 50 mg by mouth 2 (two) times daily. 01/17/20   [provider]  nitroGLYCERIN (NITROSTAT) 0.3 MG SL tablet Place 0.3 mg under the tongue every 5 (five) minutes as needed for chest pain. Max of 3 tablets daily 01/12/20   [provider]  omeprazole (PRILOSEC) 20 MG capsule Take 20 mg by mouth daily. 02/23/20   [provider]   PACERONE 200 MG tablet Take 200 mg by mouth in the morning and at bedtime. 02/07/20   [provider]  pravastatin (PRAVACHOL) 40 MG tablet Take 40 mg by mouth at bedtime. 02/23/20   [provider]  tamsulosin (FLOMAX) 0.4 MG CAPS capsule Take 0.4 mg by mouth daily. 01/31/20   [provider]    Scheduled Meds: . amiodarone  200 mg Oral BID  . diltiazem  240 mg Oral Daily  . insulin aspart  0-9 Units Subcutaneous Q4H  . levalbuterol  1.25 mg Nebulization Q8H  . metoprolol tartrate  50 mg Oral BID  . [START ON 02/28/2020] pantoprazole  40 mg Intravenous Q12H   Continuous Infusions: . pantoprozole (PROTONIX) infusion 8 mg/hr (02/24/20 2300)   PRN Meds:.acetaminophen **OR** acetaminophen, ondansetron **OR** ondansetron (ZOFRAN) IV  Allergies as of 02/24/2020 - Review Complete 02/24/2020  Allergen Reaction Noted  . Isosorbide  02/24/2020    Family History  Problem Relation Age of Onset  . Colon cancer Mother   . Hypertension Father   . Stroke Father   . Colon cancer Sister     Social History   Socioeconomic History  . Marital status: Married    Spouse name: Not on file  . Number of children: Not on file  . Years of education: Not on file  . Highest education level: Not on file  Occupational History  . Not on file  Tobacco Use  . Smoking status: Former Smoker  Substance and Sexual Activity  . Alcohol use: Never  . Drug use: Never  . Sexual activity: Not on file  Other Topics Concern  . Not on file  Social History Narrative  . Not on file   Social Determinants of Health   Financial Resource Strain:   . Difficulty of Paying Living Expenses:   Food Insecurity:   . Worried About Charity fundraiser in the Last Year:   . Arboriculturist in the Last Year:   Transportation Needs:   . Film/video editor (Medical):   Marland Kitchen Lack of Transportation (Non-Medical):   Physical Activity:   . Days of Exercise per Week:   . Minutes of Exercise per  Session:   Stress:   . Feeling of Stress :   Social Connections:   . Frequency of Communication with Friends and Family:   . Frequency of Social Gatherings with Friends and Family:   . Attends Religious Services:   . Active Member of Clubs or Organizations:   . Attends Archivist Meetings:   Marland Kitchen Marital Status:   Intimate Partner Violence:   . Fear of Current or Ex-Partner:   . Emotionally Abused:   Marland Kitchen Physically Abused:   . Sexually Abused:     Review of Systems:Review of Systems  Constitutional: Positive for malaise/fatigue.  Negative for chills, fever and weight loss.  HENT: Positive for hearing loss (chronic). Negative for tinnitus.   Eyes: Negative for blurred vision and pain.  Respiratory: Negative for cough and shortness of breath.   Cardiovascular: Negative for chest pain and palpitations.  Gastrointestinal: Positive for diarrhea and melena. Negative for abdominal pain, blood in stool, constipation, heartburn, nausea and vomiting.  Genitourinary: Negative for flank pain and hematuria.  Musculoskeletal: Negative for falls and joint pain.  Skin: Negative for itching and rash.  Neurological: Positive for weakness. Negative for loss of consciousness.  Endo/Heme/Allergies: Negative for polydipsia. Bruises/bleeds easily.  Psychiatric/Behavioral: Negative for substance abuse. The patient is not nervous/anxious.      Physical Exam: Vital signs: Vitals:   02/25/20 0458 02/25/20 0952  BP: 122/69 (!) 107/54  Pulse: 72 69  Resp: 14 16  Temp: 98.4 F (36.9 C) 98.4 F (36.9 C)  SpO2: 100% 91%   Last BM Date: 02/24/20 Physical Exam Constitutional:      General: He is not in acute distress.    Appearance: Normal appearance.  HENT:     Head: Normocephalic and atraumatic.     Mouth/Throat:     Mouth: Mucous membranes are moist.     Pharynx: Oropharynx is clear.  Eyes:     General: No scleral icterus.    Extraocular Movements: Extraocular movements intact.      Comments: Conjunctival pallor  Cardiovascular:     Rate and Rhythm: Normal rate and regular rhythm.     Pulses: Normal pulses.     Heart sounds: Normal heart sounds. No murmur heard.   Pulmonary:     Effort: Pulmonary effort is normal. No respiratory distress.     Breath sounds: Normal breath sounds.  Abdominal:     General: Bowel sounds are normal. There is no distension.     Palpations: Abdomen is soft. There is no mass.     Tenderness: There is no abdominal tenderness. There is no guarding or rebound.  Musculoskeletal:        General: No swelling, tenderness or deformity.     Right lower leg: No edema.     Left lower leg: No edema.  Skin:    General: Skin is warm and dry.  Neurological:     General: No focal deficit present.     Mental Status: He is alert and oriented to person, place, and time.  Psychiatric:        Mood and Affect: Mood normal.        Behavior: Behavior normal.      GI:  Lab Results: Recent Labs    02/24/20 2151 02/25/20 0500  WBC 4.5 4.6  HGB 7.6* 7.7*  HCT 25.4* 25.4*  PLT 192 219   BMET Recent Labs    02/24/20 2151 02/25/20 0500  NA 138 136  K 4.2 4.2  CL 104 104  CO2 26 27  GLUCOSE 91 97  BUN 24* 21  CREATININE 1.20 1.20  CALCIUM 8.7* 8.8*   LFT Recent Labs    02/24/20 2151  PROT 5.7*  ALBUMIN 3.3*  AST 14*  ALT 10  ALKPHOS 43  BILITOT 0.8   PT/INR No results for input(s): LABPROT, INR in the last 72 hours.   Studies/Results: No results found.  Impression: Suspected Upper GI bleeding in the setting of Plavix and aspirin use: possibly small bowel AVMs vs. PUD vs. gastritis -Hgb 7.7 today, stale as compared to 7.6 yesterday (improved from 5.5 after 2u pRBCs  at outside hospital) -BUN 21/Cr 1.20 -History of AVMs  A. Fib: on Eliquis (last dose of Eliquis 6/9 a.m.)  Sick sinus syndrome (s/p pacemaker)  COPD  Plan: EGD tomorrow with Dr. Cristina Gong.  I thoroughly reviewed the procedure, nature, benefits, and risks  (bleeding, infection, perforation, anesthesia/cardiac and pulmonary complications) with the patient.  Patient verbalized understanding and gave verbal consent to proceed with EGD with Dr. Ernest Haber tomorrow.  Clear liquid diet okay with n.p.o. after midnight.  Continue to monitor H&H with transfusion as needed to maintain hemoglobin greater than 7.  Continue Protonix IV infusion.  [facility] will follow.   LOS: 1 day   Salley Slaughter  PA-C 02/25/2020, 10:57 AM  Contact #  [phone]

## 2020-02-25 NOTE — Plan of Care (Signed)
  Problem: Coping: Goal: Level of anxiety will decrease Outcome: Progressing   Problem: Coping: Goal: Level of anxiety will decrease Outcome: Progressing   

## 2020-02-25 NOTE — Progress Notes (Signed)
Progress Note    GERAL Holt  MBT:[id number] DOB: 1946-06-29  DOA: 02/24/2020 PCP: Bonnita Nasuti, MD    Brief Narrative:    Medical records reviewed and are as summarized below:  Devin Holt is a very pleasant 74 y.o. male with a past medical history that includes COPD, not terminal oxygen, CAD with prior stent 2007, diabetes, GERD, paroxysmal atrial fibrillation, remote CVA, sick sinus syndrome status post pacemaker, hypertension, small bowel and colonic AVMs transferred to [facility] from [place] with GI bleeding and acute blood loss anemia.  Patient was hospitalized last month in [facility] for COPD exacerbation with hypoxia that subsequently precipitated A. fib with RVR.  He was started on Eliquis at that time in spite of his reports that he has a history of GI bleed.  Over the last 2 weeks he developed melena and gradual worsening of generalized weakness.  Saw his PCP lab work revealed a hemoglobin of 4.6.  He went to [facility] and work-up revealed a hemoglobin of 5.5.  He was transfused 2 units packed red blood cells and transferred to [facility] for GI evaluation.  He has had 1 stool since he arrived and his hemoglobin is 7.7.  Assessment/Plan:   Principal Problem:   Gastrointestinal hemorrhage with melena Active Problems:   Acute blood loss anemia   PAF (paroxysmal atrial fibrillation) (HCC)   CAD (coronary artery disease)   COPD (chronic obstructive pulmonary disease) (HCC)   Chronic respiratory failure with hypoxia and hypercapnia (HCC)   DM2 (diabetes mellitus, type 2) (HCC)   Angiodysplasia of stomach and duodenum with bleeding   AVM (arteriovenous malformation) of colon   #1.  GI bleed with melena.  Hemoglobin this morning 7.7.  He is status post 2 units of packed red blood cells yesterday.  1 melanic stools since he arrived.  No abdominal pain.  Chart review indicates patient with a history of small bowel and colonic AVMs.  Upper endoscopy in  January 2020 and underwent fulguration of small bowel AVMs.  Last colonoscopy August 2019.  These procedures were done by Dr. Lyda Jester -N.p.o. -type and screen -Gentle IV fluids -protonix gtt -gi consult -holding home eliquis  #2.  Acute blood loss anemia in setting of gi bleed and chronic  IDA..  Due to #1. status post 2 units of packed red blood cells.  Hemoglobin this morning 7.7.  Hemodynamically stable. Of note iron level 28 yesterdays labs. -Serial CBCs -Transfuse if hemoglobin 7 or less -consider IV iron -see #1  #3. PAF/hx SSS s/p pacemaker. Recently placed on eliquis.  Home medications include metoprolol, Cardizem, amiodarone. -Continue home meds -Obtain an EKG -Continue to hold Eliquis that was stopped already -May benefit cardiology consult to assist with recommendations regarding future anticoagulation depending on GI findings  #4.  COPD.  Wears oxygen at night.  Duration level greater than 90% on room air.  Medications include Trelegy, Xopenex nebs and albuterol MDI -continue nebs -monitor -oxygen supplementation as indicated -monitor oxygen saturation level  #5.  Diabetes type 2.  Home medications include Metformin.  Serum glucose 97. -Hold Metformin for now -Sliding scale insulin for optimal control  #6.  CAD.  History of CAD with prior stent to LAD and RCA in 2007.  No chest pain.  Home medications include aspirin, fenofibrate, pravastatin -Holding home meds for now    Family Communication/Anticipated D/C date and plan/Code Status   DVT prophylaxis: scd. Code Status: Full Code.  Family Communication:  patient Disposition Plan: Status is: Inpatient  Remains inpatient appropriate because:Inpatient level of care appropriate due to severity of illness   Dispo: The patient is from: Home              Anticipated d/c is to: Home              Anticipated d/c date is: 2 days              Patient currently is not medically stable to d/c.         Medical  Consultants:    [facility]   Anti-Infectives:    None  Subjective:   Sitting on side of bed. Pleasant, denies pain/discomfort.  Objective:    Vitals:   02/24/20 2028 02/25/20 0006 02/25/20 0458 02/25/20 0658  BP: (!) 144/61 116/63 122/69   Pulse: 78 69 72   Resp: 16 14 14    Temp: 98.2 F (36.8 C) 98.1 F (36.7 C) 98.4 F (36.9 C)   TempSrc: Oral Oral Oral   SpO2: 100% 97% 100%   Weight: 74 kg     Height:    5\' 8"  (1.727 m)    Intake/Output Summary (Last 24 hours) at 02/25/2020 0844 Last data filed at 02/25/2020 0600 Gross per 24 hour  Intake 310 ml  Output 400 ml  Net -90 ml   Filed Weights   02/24/20 2028  Weight: 74 kg    Exam: General: Well-nourished slightly pale no acute distress CV irregularly irregular no murmur gallop or rub no lower extremity edema Respiratory: No increased work of breathing breath sounds are clear bilaterally I hear no wheeze no rhonchi Abdomen: Soft nondistended positive bowel sounds throughout nontender to palpation no guarding or rebounding Musculoskeletal: Joints without swelling/erythema full range of motion Neuro: Alert and oriented x3 speech clear facial symmetry bilateral grip 5 out of 5 HEENT: Pupils equal round reactive to light are moist slightly pale oropharynx without erythema or exudate poor dentition Psychiatric: Pleasant cooperative insight and judgment  Data Reviewed:   I have personally reviewed following labs and imaging studies:  Labs: Labs show the following:   Basic Metabolic Panel: Recent Labs  Lab 02/24/20 2151 02/25/20 0500  NA 138 136  K 4.2 4.2  CL 104 104  CO2 26 27  GLUCOSE 91 97  BUN 24* 21  CREATININE 1.20 1.20  CALCIUM 8.7* 8.8*   GFR Estimated Creatinine Clearance: 53 mL/min (by C-G formula based on SCr of 1.2 mg/dL). Liver Function Tests: Recent Labs  Lab 02/24/20 2151  AST 14*  ALT 10  ALKPHOS 43  BILITOT 0.8  PROT 5.7*  ALBUMIN 3.3*   No results for input(s): LIPASE,  AMYLASE in the last 168 hours. No results for input(s): AMMONIA in the last 168 hours. Coagulation profile No results for input(s): INR, PROTIME in the last 168 hours.  CBC: Recent Labs  Lab 02/24/20 2151 02/25/20 0500  WBC 4.5 4.6  HGB 7.6* 7.7*  HCT 25.4* 25.4*  MCV 92.7 92.4  PLT 192 219   Cardiac Enzymes: No results for input(s): CKTOTAL, CKMB, CKMBINDEX, TROPONINI in the last 168 hours. BNP (last 3 results) No results for input(s): PROBNP in the last 8760 hours. CBG: Recent Labs  Lab 02/24/20 2044 02/25/20 0004 02/25/20 0410 02/25/20 0736  GLUCAP 87 91 99 88   D-Dimer: No results for input(s): DDIMER in the last 72 hours. Hgb A1c: Recent Labs    02/24/20 2151  HGBA1C 5.8*   Lipid Profile:  No results for input(s): CHOL, HDL, LDLCALC, TRIG, CHOLHDL, LDLDIRECT in the last 72 hours. Thyroid function studies: No results for input(s): TSH, T4TOTAL, T3FREE, THYROIDAB in the last 72 hours.  Invalid input(s): FREET3 Anemia work up: No results for input(s): VITAMINB12, FOLATE, FERRITIN, TIBC, IRON, RETICCTPCT in the last 72 hours. Sepsis Labs: Recent Labs  Lab 02/24/20 2151 02/25/20 0500  WBC 4.5 4.6    Microbiology No results found for this or any previous visit (from the past 240 hour(s)).  Procedures and diagnostic studies:  No results found.  Medications:    amiodarone  200 mg Oral BID   diltiazem  240 mg Oral Daily   insulin aspart  0-9 Units Subcutaneous Q4H   metoprolol tartrate  50 mg Oral BID   [START ON 02/28/2020] pantoprazole  40 mg Intravenous Q12H   Continuous Infusions:  pantoprozole (PROTONIX) infusion 8 mg/hr (02/24/20 2300)     LOS: 1 day   Radene Gunning NP  [facility]   How to contact the [facility] Attending or Consulting provider [place] or covering provider during after hours [place], for this patient?  1. Check the care team in [facility] and look for a) attending/consulting [facility] provider listed and b) the [facility] team  listed 2. Log into [email or web address] and use [place]'s universal password to access. If you do not have the password, please contact the hospital operator. 3. Locate the [facility] provider you are looking for under [facility] and page to a number that you can be directly reached. 4. If you still have difficulty reaching the provider, please page the [facility] (Director on Call) for the Hospitalists listed on amion for assistance.  02/25/2020, 8:44 AM

## 2020-02-26 ENCOUNTER — Inpatient Hospital Stay (HOSPITAL_COMMUNITY): Payer: Medicare HMO | Admitting: Anesthesiology

## 2020-02-26 ENCOUNTER — Encounter (HOSPITAL_COMMUNITY)
Admission: EM | Disposition: A | Discharge status: Home Health | Payer: Self-pay | Source: Other Acute Inpatient Hospital | Attending: Family Medicine

## 2020-02-26 ENCOUNTER — Encounter (HOSPITAL_COMMUNITY): Payer: Self-pay | Admitting: Internal Medicine

## 2020-02-26 HISTORY — PX: HOT HEMOSTASIS: SHX5433

## 2020-02-26 HISTORY — PX: ENTEROSCOPY: SHX5533

## 2020-02-26 LAB — CBC
HCT: 27.4 % — ABNORMAL LOW (ref 39.0–52.0)
Hemoglobin: 8.2 g/dL — ABNORMAL LOW (ref 13.0–17.0)
MCH: 27.7 pg (ref 26.0–34.0)
MCHC: 29.9 g/dL — ABNORMAL LOW (ref 30.0–36.0)
MCV: 92.6 fL (ref 80.0–100.0)
Platelets: 252 10*3/uL (ref 150–400)
RBC: 2.96 MIL/uL — ABNORMAL LOW (ref 4.22–5.81)
RDW: 15.3 % (ref 11.5–15.5)
WBC: 5.4 10*3/uL (ref 4.0–10.5)
nRBC: 0 % (ref 0.0–0.2)

## 2020-02-26 LAB — GLUCOSE, CAPILLARY
Glucose-Capillary: 102 mg/dL — ABNORMAL HIGH (ref 70–99)
Glucose-Capillary: 107 mg/dL — ABNORMAL HIGH (ref 70–99)
Glucose-Capillary: 152 mg/dL — ABNORMAL HIGH (ref 70–99)
Glucose-Capillary: 88 mg/dL (ref 70–99)
Glucose-Capillary: 90 mg/dL (ref 70–99)
Glucose-Capillary: 94 mg/dL (ref 70–99)
Glucose-Capillary: 99 mg/dL (ref 70–99)

## 2020-02-26 SURGERY — EGD, WITH ARGON PLASMA COAGULATION
Anesthesia: Monitor Anesthesia Care

## 2020-02-26 MED ORDER — LEVALBUTEROL HCL 1.25 MG/0.5ML IN NEBU
1.2500 mg | INHALATION_SOLUTION | Freq: Two times a day (BID) | RESPIRATORY_TRACT | Status: DC
  Administered 2020-02-26 – 2020-03-03 (×13): 1.25 mg via RESPIRATORY_TRACT
  Filled 2020-02-26 (×13): qty 0.5

## 2020-02-26 MED ORDER — PROPOFOL 10 MG/ML IV BOLUS
INTRAVENOUS | Status: DC | PRN
  Administered 2020-02-26 (×3): 20 mg via INTRAVENOUS

## 2020-02-26 MED ORDER — SODIUM CHLORIDE 0.9 % IV SOLN: INTRAVENOUS | Status: DC

## 2020-02-26 MED ORDER — PROPOFOL 500 MG/50ML IV EMUL
INTRAVENOUS | Status: DC | PRN
  Administered 2020-02-26: 100 ug/kg/min via INTRAVENOUS

## 2020-02-26 MED ORDER — PHENYLEPHRINE 40 MCG/ML (10ML) SYRINGE FOR IV PUSH (FOR BLOOD PRESSURE SUPPORT)
PREFILLED_SYRINGE | INTRAVENOUS | Status: DC | PRN
  Administered 2020-02-26: 40 ug via INTRAVENOUS

## 2020-02-26 MED ORDER — PEG 3350-KCL-NA BICARB-NACL 420 G PO SOLR
4000.0000 mL | Freq: Once | ORAL | Status: AC
  Administered 2020-02-26: 4000 mL via ORAL
  Filled 2020-02-26 (×2): qty 4000

## 2020-02-26 NOTE — Op Note (Signed)
[facility] Patient Name: Devin Holt Procedure Date : 02/26/2020 MRN: [id number] Attending MD: Ronald Lobo , MD Date of Birth: 06-20-1946 CSN: [id number] Age: 74 Admit Type: Inpatient Procedure:                Small bowel enteroscopy Indications:              Acute post hemorrhagic anemia (hgb 4.6, received 2                            units prc's at outside hospital), Melena, on                            aspirin and Eliquis Providers:                Ronald Lobo, MD, Jeanella Cara, RN,                            Elspeth Cho Tech., Technician, Clearnce Sorrel, CRNA Referring MD:              Medicines:                Monitored Anesthesia Care Complications:            No immediate complications. Estimated Blood Loss:     Estimated blood loss: none. Procedure:                Pre-Anesthesia Assessment:                           - Prior to the procedure, a History and Physical                            was performed, and patient medications and                            allergies were reviewed. The patient's tolerance of                            previous anesthesia was also reviewed. The risks                            and benefits of the procedure and the sedation                            options and risks were discussed with the patient.                            All questions were answered, and informed consent                            was obtained. Prior Anticoagulants: The patient has                            taken Eliquis (apixaban), last dose was 3 days  prior to procedure. ASA Grade Assessment: III - A                            patient with severe systemic disease. After                            reviewing the risks and benefits, the patient was                            deemed in satisfactory condition to undergo the                            procedure.                           After obtaining informed  consent, the endoscope was                            passed under direct vision. Throughout the                            procedure, the patient's blood pressure, pulse, and                            oxygen saturations were monitored continuously. The                            GIF-H190 ([id number] Olympus gastroscope was                            introduced through the mouth, and advanced to the                            fourth part of duodenum. The PCF-H190DL ([id number])                            Olympus pediatric colonoscope was introduced                            through the and advanced to the. After obtaining                            informed consent, the endoscope was passed under                            direct vision. Throughout the procedure, the                            patient's blood pressure, pulse, and oxygen                            saturations were monitored continuously. The small  bowel enteroscopy was accomplished without                            difficulty. The patient tolerated the procedure                            well. Scope In: Scope Out: Findings:      The examined esophagus was normal.      A single 3 mm angiodysplastic lesion with no bleeding was found on the       greater curvature of the gastric body. Fulguration to ablate the lesion       to prevent bleeding by argon plasma was successful. Estimated blood       loss: none.      Five angiodysplastic lesions with no bleeding were found in the third       portion of the duodenum and in the fourth portion of the duodenum.       Fulguration to ablate the lesion to prevent bleeding by argon plasma was       successful.      The exam was otherwise normal. No blood in stomach, no bleeding during       exam, no gastritis, ulcers, hiatal hernia, mass or other source of       bleeding seen.      A retroflexed view of the proximal stomach was normal. Impression:                - Normal esophagus.                           - A single non-bleeding angiodysplastic lesion in                            the stomach. Treated with argon plasma coagulation                            (APC).                           - Five non-bleeding angiodysplastic lesions in the                            duodenum. Treated with argon plasma coagulation                            (APC).                           - No specimens collected. Recommendation:           - Perform a colonoscopy tomorrow. In view of the                            severity of the patient's presenting anemia, the                            absence of evidence of recent bleeding from the  patient's observed gastro-duodenal AVM's, the                            presence of an indication for chronic                            anticoagulation, and the lack of availability of                            the patient's local gastroenterologist in the near                            future, I feel it is prudent to go ahead with                            examination of the patient's colon while he is                            in-house, since it has been 2 years since his most                            recent colonoscopy. Procedure Code(s):        --- Professional ---                           [phone], Small intestinal endoscopy, enteroscopy                            beyond second portion of duodenum, not including                            ileum; with control of bleeding (eg, injection,                            bipolar cautery, unipolar cautery, laser, heater                            probe, stapler, plasma coagulator) Diagnosis Code(s):        --- Professional ---                           C58.527, Angiodysplasia of stomach and duodenum                            without bleeding                           D62, Acute posthemorrhagic anemia                           K92.1, Melena  (includes Hematochezia) CPT copyright 2019 American Medical Association. All rights reserved. The codes documented in this report are preliminary and upon coder review may  be revised to meet current compliance requirements. Ronald Lobo, MD 02/26/2020 8:59:57 AM This report has been signed electronically. Number of Addenda: 0

## 2020-02-26 NOTE — Interval H&P Note (Signed)
History and Physical Interval Note:  02/26/2020 7:54 AM  Devin Holt  has presented today for surgery, with the diagnosis of upper GI bleeding.  The various methods of treatment have been discussed with the patient. After consideration of risks, benefits and other options for treatment, the patient has consented to  Procedure(s): ESOPHAGOGASTRODUODENOSCOPY (EGD) (N/A) as a surgical intervention.  The patient's history has been reviewed, patient examined, no change in status, stable for surgery.  I have reviewed the patient's chart and labs.  Questions were answered to the patient's satisfaction.     [name]

## 2020-02-26 NOTE — Anesthesia Preprocedure Evaluation (Signed)
Anesthesia Evaluation  Patient identified by MRN, date of birth, ID band Patient awake    Reviewed: Allergy & Precautions, NPO status , Patient's Chart, lab work & pertinent test results  Airway Mallampati: II  TM Distance: >3 FB Neck ROM: Full    Dental  (+) Edentulous Upper, Edentulous Lower   Pulmonary former smoker,    breath sounds clear to auscultation       Cardiovascular  Rhythm:Irregular Rate:Normal     Neuro/Psych    GI/Hepatic   Endo/Other  diabetes  Renal/GU      Musculoskeletal   Abdominal   Peds  Hematology   Anesthesia Other Findings   Reproductive/Obstetrics                             Anesthesia Physical Anesthesia Plan  ASA: III  Anesthesia Plan: MAC   Post-op Pain Management:    Induction: Intravenous  PONV Risk Score and Plan: Propofol infusion  Airway Management Planned: Natural Airway and Nasal Cannula  Additional Equipment:   Intra-op Plan:   Post-operative Plan:   Informed Consent: I have reviewed the patients History and Physical, chart, labs and discussed the procedure including the risks, benefits and alternatives for the proposed anesthesia with the patient or authorized representative who has indicated his/her understanding and acceptance.       Plan Discussed with: Anesthesiologist and CRNA  Anesthesia Plan Comments:         Anesthesia Quick Evaluation

## 2020-02-26 NOTE — Progress Notes (Signed)
Hemoglobin stable overnight, without need for transfusion since admission.  Will proceed to endoscopic evaluation this morning.  Risks of endoscopy and possible argon plasma coagulation reviewed with patient and he is agreeable.  It has been 3 days since his last dose of Eliquis and aspirin.  It is noted that he has a prior history of gastroduodenal AVMs requiring cautery.  Prior to admission, he was on low-dose aspirin as well as Eliquis, but was on omeprazole and famotidine as well.  The patient was on Eliquis in the remote past but does not recall if it led to bleeding complications.  [name], M.D. Pager [phone] If no answer or after 5 PM call [phone]

## 2020-02-26 NOTE — Anesthesia Postprocedure Evaluation (Signed)
Anesthesia Post Note  Patient: Devin Holt  Procedure(s) Performed: HOT HEMOSTASIS (ARGON PLASMA COAGULATION/BICAP) (N/A ) ENTEROSCOPY (N/A )     Patient location during evaluation: PACU Anesthesia Type: MAC Level of consciousness: awake and alert Pain management: pain level controlled Vital Signs Assessment: post-procedure vital signs reviewed and stable Respiratory status: spontaneous breathing, nonlabored ventilation, respiratory function stable and patient connected to nasal cannula oxygen Cardiovascular status: stable and blood pressure returned to baseline Postop Assessment: no apparent nausea or vomiting Anesthetic complications: no   No complications documented.  Last Vitals:  Vitals:   02/26/20 0902 02/26/20 0950  BP: (!) 117/55 108/63  Pulse: 89 69  Resp: 19 18  Temp: 36.9 C 36.8 C  SpO2: 94% (!) 85%    Last Pain:  Vitals:   02/26/20 0950  TempSrc: Oral  PainSc:                  Mida Cory COKER

## 2020-02-26 NOTE — Progress Notes (Signed)
PROGRESS NOTE    Devin Holt  KNL:[id number] DOB: July 09, 1946 DOA: 02/24/2020 PCP: Bonnita Nasuti, MD   Brief Narrative:  Devin Holt is a very pleasant 74 y.o. male with a past medical history that includes COPD, not terminal oxygen, CAD with prior stent 2007, diabetes, GERD, paroxysmal atrial fibrillation, remote CVA, sick sinus syndrome status post pacemaker, hypertension, small bowel and colonic AVMs transferred to [facility] from [place] with GI bleeding and acute blood loss anemia.  Patient was hospitalized last month in [facility] for COPD exacerbation with hypoxia that subsequently precipitated A. fib with RVR.  He was started on Eliquis at that time in spite of his reports that he has a history of GI bleed.  Over the last 2 weeks he developed melena and gradual worsening of generalized weakness.  Saw his PCP lab work revealed a hemoglobin of 4.6.  He went to [facility] and work-up revealed a hemoglobin of 5.5.  He was transfused 2 units packed red blood cells and transferred to [facility] for GI evaluation.  He has had 1 stool since he arrived and his hemoglobin is 7.7.  Assessment & Plan:   Principal Problem:   Gastrointestinal hemorrhage with melena Active Problems:   PAF (paroxysmal atrial fibrillation) (HCC)   CAD (coronary artery disease)   COPD (chronic obstructive pulmonary disease) (HCC)   Chronic respiratory failure with hypoxia and hypercapnia (HCC)   DM2 (diabetes mellitus, type 2) (HCC)   Acute blood loss anemia   Angiodysplasia of stomach and duodenum with bleeding   AVM (arteriovenous malformation) of colon   #1.  GI bleed with melena/acute loss anemia: Hemoglobin 5.6.  Received 2 units of PRBC transfusion at [facility] before discharge.  Posttransfusion hemoglobin 7.6 and has remained over 7 and stable since last 2 days without any further requirement for transfusion.  He remains on Protonix.  GI consulted.  Status post EGD today which did  not reveal any source of infection.  GI recommends and plans for colonoscopy tomorrow morning.  Continue to hold Eliquis.  Transfuse if hemoglobin less than 7.    #2. PAF/hx SSS s/p pacemaker. Recently placed on eliquis.  Home medications include metoprolol, Cardizem, amiodarone. -Continue home meds  #3.  COPD.  Wears oxygen at night.  Continue home medications.  #4.  Diabetes type 2.  Home medications include Metformin.  Serum glucose 97. -Hold Metformin for now.  Continue SSI. -Sliding scale insulin for optimal control  #5.  CAD.  History of CAD with prior stent to LAD and RCA in 2007.  No chest pain.  Home medications include aspirin, fenofibrate, pravastatin -Holding home meds for now   DVT prophylaxis: SCDs Start: 02/24/20 2134   Code Status: Full Code  Family Communication:  None present at bedside.  Plan of care discussed with patient in length and he verbalized understanding and agreed with it.  Status is: Inpatient  Remains inpatient appropriate because:Ongoing diagnostic testing needed not appropriate for outpatient work up   Dispo:  Patient From: Home  Planned Disposition: Home  Expected discharge date: 02/26/20  Medically stable for discharge: No         Estimated body mass index is 24.94 kg/m as calculated from the following:   Height as of this encounter: 5\' 8"  (1.727 m).   Weight as of this encounter: 74.4 kg.      Nutritional status:               Consultants:  GI  Procedures:   EGD 02/26/2020  Antimicrobials:  Anti-infectives (From admission, onward)   None         Subjective: Seen and examined after EGD.  He has no complaints.  Objective: Vitals:   02/26/20 0855 02/26/20 0902 02/26/20 0950 02/26/20 1433  BP: (!) 98/38 (!) 117/55 108/63   Pulse: 81 89 69   Resp: 15 19 18    Temp:  98.5 F (36.9 C) 98.2 F (36.8 C)   TempSrc:   Oral   SpO2: 95% 94% (!) 85% 97%  Weight:      Height:        Intake/Output  Summary (Last 24 hours) at 02/26/2020 1455 Last data filed at 02/26/2020 1300 Gross per 24 hour  Intake 1531.5 ml  Output 200 ml  Net 1331.5 ml   Filed Weights   02/24/20 2028 02/26/20 0740  Weight: 74 kg 74.4 kg    Examination:  General exam: Appears calm and comfortable  Respiratory system: Clear to auscultation. Respiratory effort normal. Cardiovascular system: S1 & S2 heard, RRR. No JVD, murmurs, rubs, gallops or clicks. No pedal edema. Gastrointestinal system: Abdomen is nondistended, soft and nontender. No organomegaly or masses felt. Normal bowel sounds heard. Central nervous system: Alert and oriented. No focal neurological deficits. Extremities: Symmetric 5 x 5 power. Skin: No rashes, lesions or ulcers Psychiatry: Judgement and insight appear normal. Mood & affect appropriate.    Data Reviewed: I have personally reviewed following labs and imaging studies  CBC: Recent Labs  Lab 02/24/20 2151 02/25/20 0500 02/25/20 1353 02/26/20 0720  WBC 4.5 4.6 5.6 5.4  HGB 7.6* 7.7* 8.2* 8.2*  HCT 25.4* 25.4* 27.1* 27.4*  MCV 92.7 92.4 91.9 92.6  PLT 192 219 236 176   Basic Metabolic Panel: Recent Labs  Lab 02/24/20 2151 02/25/20 0500  NA 138 136  K 4.2 4.2  CL 104 104  CO2 26 27  GLUCOSE 91 97  BUN 24* 21  CREATININE 1.20 1.20  CALCIUM 8.7* 8.8*   GFR: Estimated Creatinine Clearance: 53 mL/min (by C-G formula based on SCr of 1.2 mg/dL). Liver Function Tests: Recent Labs  Lab 02/24/20 2151  AST 14*  ALT 10  ALKPHOS 43  BILITOT 0.8  PROT 5.7*  ALBUMIN 3.3*   No results for input(s): LIPASE, AMYLASE in the last 168 hours. No results for input(s): AMMONIA in the last 168 hours. Coagulation Profile: No results for input(s): INR, PROTIME in the last 168 hours. Cardiac Enzymes: No results for input(s): CKTOTAL, CKMB, CKMBINDEX, TROPONINI in the last 168 hours. BNP (last 3 results) No results for input(s): PROBNP in the last 8760 hours. HbA1C: Recent Labs      02/24/20 2151  HGBA1C 5.8*   CBG: Recent Labs  Lab 02/26/20 0042 02/26/20 0428 02/26/20 0712 02/26/20 0848 02/26/20 1136  GLUCAP 102* 99 90 107* 88   Lipid Profile: No results for input(s): CHOL, HDL, LDLCALC, TRIG, CHOLHDL, LDLDIRECT in the last 72 hours. Thyroid Function Tests: No results for input(s): TSH, T4TOTAL, FREET4, T3FREE, THYROIDAB in the last 72 hours. Anemia Panel: No results for input(s): VITAMINB12, FOLATE, FERRITIN, TIBC, IRON, RETICCTPCT in the last 72 hours. Sepsis Labs: No results for input(s): PROCALCITON, LATICACIDVEN in the last 168 hours.  No results found for this or any previous visit (from the past 240 hour(s)).    Radiology Studies: No results found.  Scheduled Meds: . amiodarone  200 mg Oral BID  . diltiazem  240 mg Oral Daily  .  insulin aspart  0-9 Units Subcutaneous Q4H  . levalbuterol  1.25 mg Nebulization BID  . metoprolol tartrate  50 mg Oral BID  . [START ON 02/28/2020] pantoprazole  40 mg Intravenous Q12H  . polyethylene glycol-electrolytes  4,000 mL Oral Once   Continuous Infusions: . sodium chloride    . pantoprozole (PROTONIX) infusion 8 mg/hr (02/26/20 0019)     LOS: 2 days   Time spent: 28 minutes   Darliss Cheney, MD [facility]  02/26/2020, 2:55 PM   To contact the attending provider between 7A-7P or the covering provider during after hours 7P-7A, please log into the web site [email or web address].

## 2020-02-26 NOTE — Progress Notes (Signed)
Patient's upper endoscopy/proximal small bowel enteroscopy was well-tolerated.  There was no evidence of active or recent bleeding in the upper tract, but the patient did have multiple small angiodysplastic lesions, which I cauterized with argon plasma coagulation therapy.  We will plan for colonoscopy tomorrow, since it has reportedly been 2 years since his most recent colonoscopy, since the patient has an indication for ongoing anticoagulation, since there was not a definite source of his bleeding in the upper tract, and since reportedly his local gastroenterologist will not be available for a period of time.  I have reviewed the purpose and risks of the procedure, with which he is well familiar, and he is agreeable to proceed.  Devin Holt, M.D. Pager [phone] If no answer or after 5 PM call [phone]

## 2020-02-26 NOTE — Transfer of Care (Signed)
Immediate Anesthesia Transfer of Care Note  Patient: Devin Holt  Procedure(s) Performed: ESOPHAGOGASTRODUODENOSCOPY (EGD) (N/A ) HOT HEMOSTASIS (ARGON PLASMA COAGULATION/BICAP) (N/A )  Patient Location: PACU  Anesthesia Type:MAC  Level of Consciousness: sedated  Airway & Oxygen Therapy: Patient Spontanous Breathing and Patient connected to nasal cannula oxygen  Post-op Assessment: Report given to RN and Post -op Vital signs reviewed and stable  Post vital signs: Reviewed and stable  Last Vitals:  Vitals Value Taken Time  BP 89/32 02/26/20 0847  Temp    Pulse 74 02/26/20 0848  Resp 29 02/26/20 0848  SpO2 96 % 02/26/20 0848  Vitals shown include unvalidated device data.  Last Pain:  Vitals:   02/26/20 0740  TempSrc: Oral  PainSc: 0-No pain         Complications: No complications documented.

## 2020-02-27 ENCOUNTER — Inpatient Hospital Stay (HOSPITAL_COMMUNITY): Payer: Medicare HMO | Admitting: Anesthesiology

## 2020-02-27 ENCOUNTER — Encounter (HOSPITAL_COMMUNITY)
Admission: EM | Disposition: A | Discharge status: Home Health | Payer: Self-pay | Source: Other Acute Inpatient Hospital | Attending: Family Medicine

## 2020-02-27 ENCOUNTER — Encounter (HOSPITAL_COMMUNITY): Payer: Self-pay | Admitting: Internal Medicine

## 2020-02-27 HISTORY — PX: HOT HEMOSTASIS: SHX5433

## 2020-02-27 HISTORY — PX: POLYPECTOMY: SHX5525

## 2020-02-27 HISTORY — PX: COLONOSCOPY WITH PROPOFOL: SHX5780

## 2020-02-27 LAB — CBC WITH DIFFERENTIAL/PLATELET
Abs Immature Granulocytes: 0.02 10*3/uL (ref 0.00–0.07)
Basophils Absolute: 0 10*3/uL (ref 0.0–0.1)
Basophils Relative: 1 %
Eosinophils Absolute: 0.5 10*3/uL (ref 0.0–0.5)
Eosinophils Relative: 12 %
HCT: 26.5 % — ABNORMAL LOW (ref 39.0–52.0)
Hemoglobin: 7.8 g/dL — ABNORMAL LOW (ref 13.0–17.0)
Immature Granulocytes: 1 %
Lymphocytes Relative: 16 %
Lymphs Abs: 0.7 10*3/uL (ref 0.7–4.0)
MCH: 27.5 pg (ref 26.0–34.0)
MCHC: 29.4 g/dL — ABNORMAL LOW (ref 30.0–36.0)
MCV: 93.3 fL (ref 80.0–100.0)
Monocytes Absolute: 0.5 10*3/uL (ref 0.1–1.0)
Monocytes Relative: 11 %
Neutro Abs: 2.6 10*3/uL (ref 1.7–7.7)
Neutrophils Relative %: 59 %
Platelets: 217 10*3/uL (ref 150–400)
RBC: 2.84 MIL/uL — ABNORMAL LOW (ref 4.22–5.81)
RDW: 15.5 % (ref 11.5–15.5)
WBC: 4.4 10*3/uL (ref 4.0–10.5)
nRBC: 0 % (ref 0.0–0.2)

## 2020-02-27 LAB — GLUCOSE, CAPILLARY
Glucose-Capillary: 105 mg/dL — ABNORMAL HIGH (ref 70–99)
Glucose-Capillary: 119 mg/dL — ABNORMAL HIGH (ref 70–99)
Glucose-Capillary: 127 mg/dL — ABNORMAL HIGH (ref 70–99)
Glucose-Capillary: 85 mg/dL (ref 70–99)
Glucose-Capillary: 88 mg/dL (ref 70–99)
Glucose-Capillary: 92 mg/dL (ref 70–99)

## 2020-02-27 SURGERY — COLONOSCOPY WITH PROPOFOL
Anesthesia: Monitor Anesthesia Care

## 2020-02-27 MED ORDER — PROPOFOL 10 MG/ML IV BOLUS
INTRAVENOUS | Status: DC | PRN
  Administered 2020-02-27: 30 mg via INTRAVENOUS

## 2020-02-27 MED ORDER — LACTATED RINGERS IV SOLN: INTRAVENOUS | Status: DC | PRN

## 2020-02-27 MED ORDER — LACTATED RINGERS IV SOLN
INTRAVENOUS | Status: AC | PRN
  Administered 2020-02-27: 10 mL/h via INTRAVENOUS

## 2020-02-27 MED ORDER — PROPOFOL 500 MG/50ML IV EMUL
INTRAVENOUS | Status: DC | PRN
  Administered 2020-02-27: 100 ug/kg/min via INTRAVENOUS

## 2020-02-27 SURGICAL SUPPLY — 22 items

## 2020-02-27 NOTE — Progress Notes (Signed)
PROGRESS NOTE    Devin Holt  PPJ:[id number] DOB: 1946/03/23 DOA: 02/24/2020 PCP: Bonnita Nasuti, MD   Brief Narrative:  Devin Holt is a very pleasant 74 y.o. male with a past medical history that includes COPD, not terminal oxygen, CAD with prior stent 2007, diabetes, GERD, paroxysmal atrial fibrillation, remote CVA, sick sinus syndrome status post pacemaker, hypertension, small bowel and colonic AVMs transferred to [facility] from [place] with GI bleeding and acute blood loss anemia.  Patient was hospitalized last month in [facility] for COPD exacerbation with hypoxia that subsequently precipitated A. fib with RVR.  He was started on Eliquis at that time in spite of his reports that he has a history of GI bleed.  Over the last 2 weeks he developed melena and gradual worsening of generalized weakness.  Saw his PCP lab work revealed a hemoglobin of 4.6.  He went to [facility] and work-up revealed a hemoglobin of 5.5.  He was transfused 2 units packed red blood cells and transferred to [facility] for GI evaluation.  He has had 1 stool since he arrived and his hemoglobin is 7.7.  Patient underwent EGD on 02/26/2020 which did not reveal any source of bleeding.  He then underwent colonoscopy on 02/27/2020 which showed a small polyp in transverse colon which was resected.  3 small angiectasia's which were treated with APC.  Assessment & Plan:   Principal Problem:   Gastrointestinal hemorrhage with melena Active Problems:   PAF (paroxysmal atrial fibrillation) (HCC)   CAD (coronary artery disease)   COPD (chronic obstructive pulmonary disease) (HCC)   Chronic respiratory failure with hypoxia and hypercapnia (HCC)   DM2 (diabetes mellitus, type 2) (HCC)   Acute blood loss anemia   Angiodysplasia of stomach and duodenum with bleeding   AVM (arteriovenous malformation) of colon   #1.  GI bleed with melena/acute loss anemia: Hemoglobin 5.6.  Received 2 units of PRBC transfusion at  [facility] before discharge.  Posttransfusion hemoglobin 7.6 and has remained over 7 and stable since last 3 days without any further requirement for transfusion.  He remains on Protonix.  GI consulted.  Status post EGD 02/26/2020 which did not reveal any source of bleeding.  He then underwent colonoscopy on 02/27/2020 which showed a polyp in the transverse colon and 3 AVMs treated with APC.  GI recommends capsule endoscopy for which patient will stay overnight and received this procedure tomorrow.  Continue to hold Eliquis.  #2. PAF/hx SSS s/p pacemaker. Recently placed on eliquis.  Home medications include metoprolol, Cardizem, amiodarone. -Continue home meds except Eliquis.  Rates controlled.  #3.  COPD.  Wears oxygen at night.  Continue home medications.  #4.  Diabetes type 2.  Home medications include Metformin.  Serum glucose 97. -Hold Metformin for now.  Continue SSI. -Sliding scale insulin for optimal control  #5.  CAD.  History of CAD with prior stent to LAD and RCA in 2007.  No chest pain.  Home medications include aspirin, fenofibrate, pravastatin -Holding home meds for now   DVT prophylaxis: SCDs Start: 02/24/20 2134   Code Status: Full Code  Family Communication:  None present at bedside.  Plan of care discussed with patient in length and he verbalized understanding and agreed with it.  Status is: Inpatient  Remains inpatient appropriate because:Ongoing diagnostic testing needed not appropriate for outpatient work up   Dispo:  Patient From: Home  Planned Disposition: Home  Expected discharge date: 1 day  Medically stable  for discharge: No         Estimated body mass index is 24.37 kg/m as calculated from the following:   Height as of this encounter: 5\' 8"  (1.727 m).   Weight as of this encounter: 72.7 kg.      Nutritional status:               Consultants:   GI  Procedures:   EGD 02/26/2020  Colonoscopy 02/27/2020  Antimicrobials:   Anti-infectives (From admission, onward)   None         Subjective: Patient seen and examined.  He has no complaints.  Objective: Vitals:   02/27/20 1132 02/27/20 1147 02/27/20 1157 02/27/20 1217  BP: 105/67 (!) 125/59 123/62 127/60  Pulse: 73 74 77 76  Resp: 17 14 18 18   Temp: 97.7 F (36.5 C)   99.4 F (37.4 C)  TempSrc: Oral   Oral  SpO2: 90% 98% 90% 94%  Weight:      Height:        Intake/Output Summary (Last 24 hours) at 02/27/2020 1329 Last data filed at 02/27/2020 1300 Gross per 24 hour  Intake 1976.33 ml  Output 0 ml  Net 1976.33 ml   Filed Weights   02/26/20 0740 02/26/20 2037 02/27/20 1015  Weight: 74.4 kg 72.7 kg 72.7 kg    Examination:  General exam: Appears calm and comfortable  Respiratory system: Clear to auscultation. Respiratory effort normal. Cardiovascular system: S1 & S2 heard, RRR. No JVD, murmurs, rubs, gallops or clicks. No pedal edema. Gastrointestinal system: Abdomen is nondistended, soft and nontender. No organomegaly or masses felt. Normal bowel sounds heard. Central nervous system: Alert and oriented. No focal neurological deficits. Extremities: Symmetric 5 x 5 power. Skin: No rashes, lesions or ulcers.  Psychiatry: Judgement and insight appear normal. Mood & affect appropriate.    Data Reviewed: I have personally reviewed following labs and imaging studies  CBC: Recent Labs  Lab 02/24/20 2151 02/25/20 0500 02/25/20 1353 02/26/20 0720 02/27/20 0848  WBC 4.5 4.6 5.6 5.4 4.4  NEUTROABS  --   --   --   --  2.6  HGB 7.6* 7.7* 8.2* 8.2* 7.8*  HCT 25.4* 25.4* 27.1* 27.4* 26.5*  MCV 92.7 92.4 91.9 92.6 93.3  PLT 192 219 236 252 629   Basic Metabolic Panel: Recent Labs  Lab 02/24/20 2151 02/25/20 0500  NA 138 136  K 4.2 4.2  CL 104 104  CO2 26 27  GLUCOSE 91 97  BUN 24* 21  CREATININE 1.20 1.20  CALCIUM 8.7* 8.8*   GFR: Estimated Creatinine Clearance: 53 mL/min (by C-G formula based on SCr of 1.2 mg/dL). Liver  Function Tests: Recent Labs  Lab 02/24/20 2151  AST 14*  ALT 10  ALKPHOS 43  BILITOT 0.8  PROT 5.7*  ALBUMIN 3.3*   No results for input(s): LIPASE, AMYLASE in the last 168 hours. No results for input(s): AMMONIA in the last 168 hours. Coagulation Profile: No results for input(s): INR, PROTIME in the last 168 hours. Cardiac Enzymes: No results for input(s): CKTOTAL, CKMB, CKMBINDEX, TROPONINI in the last 168 hours. BNP (last 3 results) No results for input(s): PROBNP in the last 8760 hours. HbA1C: Recent Labs    02/24/20 2151  HGBA1C 5.8*   CBG: Recent Labs  Lab 02/26/20 2033 02/27/20 0041 02/27/20 0454 02/27/20 0713 02/27/20 1214  GLUCAP 152* 105* 92 88 85   Lipid Profile: No results for input(s): CHOL, HDL, LDLCALC, TRIG, CHOLHDL, LDLDIRECT in the  last 72 hours. Thyroid Function Tests: No results for input(s): TSH, T4TOTAL, FREET4, T3FREE, THYROIDAB in the last 72 hours. Anemia Panel: No results for input(s): VITAMINB12, FOLATE, FERRITIN, TIBC, IRON, RETICCTPCT in the last 72 hours. Sepsis Labs: No results for input(s): PROCALCITON, LATICACIDVEN in the last 168 hours.  No results found for this or any previous visit (from the past 240 hour(s)).    Radiology Studies: No results found.  Scheduled Meds: . amiodarone  200 mg Oral BID  . diltiazem  240 mg Oral Daily  . insulin aspart  0-9 Units Subcutaneous Q4H  . levalbuterol  1.25 mg Nebulization BID  . metoprolol tartrate  50 mg Oral BID  . [START ON 02/28/2020] pantoprazole  40 mg Intravenous Q12H   Continuous Infusions: . pantoprozole (PROTONIX) infusion 8 mg/hr (02/26/20 0019)     LOS: 3 days   Time spent: 27 minutes   Darliss Cheney, MD [facility]  02/27/2020, 1:29 PM   To contact the attending provider between 7A-7P or the covering provider during after hours 7P-7A, please log into the web site [email or web address].

## 2020-02-27 NOTE — Op Note (Signed)
[facility] Patient Name: Devin Holt Procedure Date : 02/27/2020 MRN: [id number] Attending MD: Ronald Lobo , MD Date of Birth: [birth year] CSN: [id number] Age: 74 Admit Type: Inpatient Procedure:                Colonoscopy Indications:              Last colonoscopy within the past 3 years (Dr.                            Lyda Jester, [place], [place]), Melena, h/o AVM's,                            Acute post hemorrhagic anemia (hgb 4.6) while on                            Eliquis Providers:                Ronald Lobo, MD, Jeanella Cara, RN,                            Laverda Sorenson, Technician, Eligha Bridegroom CRNA, CRNA Referring MD:              Medicines:                Monitored Anesthesia Care Complications:            No immediate complications. Estimated Blood Loss:     Estimated blood loss: 1 mL. Procedure:                Pre-Anesthesia Assessment:                           - Prior to the procedure, a History and Physical                            was performed, and patient medications and                            allergies were reviewed. The patient's tolerance of                            previous anesthesia was also reviewed. The risks                            and benefits of the procedure and the sedation                            options and risks were discussed with the patient.                            All questions were answered, and informed consent                            was obtained. Prior Anticoagulants: The patient has  taken Eliquis (apixaban), last dose was 4 days                            prior to procedure. ASA Grade Assessment: III - A                            patient with severe systemic disease. After                            reviewing the risks and benefits, the patient was                            deemed in satisfactory condition to undergo the                            procedure.                            After obtaining informed consent, the colonoscope                            was passed under direct vision. Throughout the                            procedure, the patient's blood pressure, pulse, and                            oxygen saturations were monitored continuously. The                            CF-HQ190L ([id number]) Olympus colonoscope was                            introduced through the anus and advanced to the the                            terminal ileum. The colonoscopy was performed                            without difficulty. The patient tolerated the                            procedure well. The quality of the bowel                            preparation was excellent. Scope In: 10:58:56 AM Scope Out: 11:21:56 AM Scope Withdrawal Time: 0 hours 20 minutes 22 seconds  Total Procedure Duration: 0 hours 23 minutes 0 seconds  Findings:      The perianal and digital rectal examinations were normal. Pertinent       negatives include normal prostate (size, shape, and consistency).      A 6 mm polyp was found in the transverse colon. The polyp was       semi-pedunculated. The polyp was removed with a cold snare. Resection  and retrieval were complete. Estimated blood loss: 1 mL.      Three small angioectasias without bleeding were found in the cecum an       ascending colon. The cecal lesion started oozing after being rubbed with       the scope. Fulguration to ablate the lesion to prevent future bleeding       by argon plasma was successful for all three lesions.      There were also some areas of slight vascular prominence that were not       discrete vascular lesions that I elected not to APC.      The exam was otherwise normal throughout the examined colon.      There is no endoscopic evidence of diverticula, inflammation or mass in       the entire colon.      The retroflexed view of the distal rectum and anal verge was normal and       showed  no anal or rectal abnormalities. Impression:               - One 6 mm polyp in the transverse colon, removed                            with a cold snare. Resected and retrieved.                           - Three non-bleeding colonic angioectasias. Treated                            with argon plasma coagulation (APC).                           - The distal rectum and anal verge are normal on                            retroflexion view. Recommendation:           - Await pathology results. However, in view of the                            small size of the polyp and the patient's age and                            co-morbidities, routine surveillance colonoscopy is                            not needed.                           - Follow clinically, ideally off Eliquis for at                            least another week to allow healing of the APC                            sites. Procedure Code(s):        --- Professional ---  88891, 59, Colonoscopy, flexible; with control of                            bleeding, any method                           45385, Colonoscopy, flexible; with removal of                            tumor(s), polyp(s), or other lesion(s) by snare                            technique Diagnosis Code(s):        --- Professional ---                           K63.5, Polyp of colon                           K55.20, Angiodysplasia of colon without hemorrhage                           K92.1, Melena (includes Hematochezia)                           D62, Acute posthemorrhagic anemia CPT copyright 2019 [employer]. All rights reserved. The codes documented in this report are preliminary and upon coder review may  be revised to meet current compliance requirements. Ronald Lobo, MD 02/27/2020 11:48:28 AM This report has been signed electronically. Number of Addenda: 0

## 2020-02-27 NOTE — Progress Notes (Signed)
Patient's colonoscopy was well-tolerated.  It revealed several small vascular ectasia in the proximal colon, 1 of which started oozing when rubbed with the scope.  Each of these was treated with argon plasma coagulation therapy.  The patient also had other areas of more equivocal vascular ectasia, which I did not cauterize.  There was no blood in the colonic lumen at the time of the exam, so the patient has clearly stopped bleeding, even though today's hemoglobin is slightly lower than yesterday's (although it remains similar to what he has been running the past several days).  Recommendations:  1.  Capsule endoscopy study tomorrow.  I discussed this with the patient and he is agreeable.  I think the study is important because, with vascular ectasia noted on his proximal small bowel enteroscopy yesterday, and colonoscopy today, there is a good chance that he has additional lesions in his small bowel.  It would be helpful to know that, from the standpoint of deciding whether or not he should remain on Eliquis.  2.  Ideally, the patient would remain off Eliquis for a week or so, to allow the sites of his argon plasma coagulation cautery to heal before resumption of anticoagulation.  3.  The decision about whether to resume anticoagulation in this patient will be difficult and I would defer that decision to cardiology, taking into account the patient's relative risk of developing embolic complications without anticoagulation, versus his known, recurrent AVMs, and the severity of the anemia with which he presented this time.  Although I do not have complete records available, I on "care everywhere" where the patient had a capsule endoscopy performed for anemia in September, 2016, and more recent endoscopy and colonoscopy by Dr. Octavia Bruckner Misenheimer in [place], presumably for anemia as well.  [name], M.D. Pager [phone] If no answer or after 5 PM call [phone]

## 2020-02-27 NOTE — Anesthesia Preprocedure Evaluation (Signed)
Anesthesia Evaluation  Patient identified by MRN, date of birth, ID band Patient awake    Reviewed: Allergy & Precautions, H&P , NPO status , Patient's Chart, lab work & pertinent test results, reviewed documented beta blocker date and time   Airway Mallampati: II  TM Distance: >3 FB Neck ROM: Full    Dental no notable dental hx. (+) Edentulous Upper, Edentulous Lower, Dental Advisory Given   Pulmonary COPD,  COPD inhaler, former smoker,    Pulmonary exam normal breath sounds clear to auscultation       Cardiovascular hypertension, Pt. on home beta blockers + CAD, + Past MI, + Cardiac Stents and +CHF  negative cardio ROS   Rhythm:Irregular Rate:Normal     Neuro/Psych CVA negative neurological ROS  negative psych ROS   GI/Hepatic negative GI ROS, Neg liver ROS,   Endo/Other  negative endocrine ROSdiabetes, Type 2, Oral Hypoglycemic Agents  Renal/GU negative Renal ROS  negative genitourinary   Musculoskeletal   Abdominal   Peds  Hematology  (+) Blood dyscrasia, anemia ,   Anesthesia Other Findings   Reproductive/Obstetrics negative OB ROS                             Anesthesia Physical Anesthesia Plan  ASA: III  Anesthesia Plan: MAC   Post-op Pain Management:    Induction: Intravenous  PONV Risk Score and Plan: 1 and Propofol infusion  Airway Management Planned: Simple Face Mask  Additional Equipment:   Intra-op Plan:   Post-operative Plan:   Informed Consent: I have reviewed the patients History and Physical, chart, labs and discussed the procedure including the risks, benefits and alternatives for the proposed anesthesia with the patient or authorized representative who has indicated his/her understanding and acceptance.     Dental advisory given  Plan Discussed with: CRNA  Anesthesia Plan Comments:         Anesthesia Quick Evaluation

## 2020-02-27 NOTE — Anesthesia Procedure Notes (Signed)
Procedure Name: MAC Date/Time: 02/27/2020 10:51 AM Performed by: Eligha Bridegroom, CRNA Pre-anesthesia Checklist: Patient identified, Emergency Drugs available, Suction available, Patient being monitored and Timeout performed Patient Re-evaluated:Patient Re-evaluated prior to induction Oxygen Delivery Method: Circle system utilized Preoxygenation: Pre-oxygenation with 100% oxygen Induction Type: IV induction

## 2020-02-27 NOTE — Interval H&P Note (Signed)
History and Physical Interval Note:  02/27/2020 10:30 AM  Devin Holt  has presented today for surgery, with the diagnosis of Gastrointestinal bleeding with anemia.  The various methods of treatment have been discussed with the patient. After consideration of risks, benefits and other options for treatment, the patient has consented to  Procedure(s): COLONOSCOPY WITH PROPOFOL (N/A) as a surgical intervention.  The patient's history has been reviewed, patient examined, no change in status, stable for surgery.  I have reviewed the patient's chart and labs.  Questions were answered to the patient's satisfaction.     Youlanda Mighty [name]

## 2020-02-27 NOTE — Anesthesia Postprocedure Evaluation (Signed)
Anesthesia Post Note  Patient: Devin Holt  Procedure(s) Performed: COLONOSCOPY WITH PROPOFOL (N/A ) POLYPECTOMY HOT HEMOSTASIS (ARGON PLASMA COAGULATION/BICAP) (N/A )     Patient location during evaluation: Endoscopy Anesthesia Type: MAC Level of consciousness: awake and alert Pain management: pain level controlled Vital Signs Assessment: post-procedure vital signs reviewed and stable Respiratory status: spontaneous breathing, nonlabored ventilation and respiratory function stable Cardiovascular status: stable and blood pressure returned to baseline Postop Assessment: no apparent nausea or vomiting Anesthetic complications: no   No complications documented.  Last Vitals:  Vitals:   02/27/20 1147 02/27/20 1157  BP: (!) 125/59 123/62  Pulse: 74 77  Resp: 14 18  Temp:    SpO2: 98% 90%    Last Pain:  Vitals:   02/27/20 1157  TempSrc:   PainSc: 0-No pain                 Khye Hochstetler,W. EDMOND

## 2020-02-27 NOTE — Transfer of Care (Signed)
Immediate Anesthesia Transfer of Care Note  Patient: Devin Holt  Procedure(s) Performed: COLONOSCOPY WITH PROPOFOL (N/A ) POLYPECTOMY HOT HEMOSTASIS (ARGON PLASMA COAGULATION/BICAP) (N/A )  Patient Location: PACU  Anesthesia Type:MAC  Level of Consciousness: awake and alert   Airway & Oxygen Therapy: Patient Spontanous Breathing and Patient connected to nasal cannula oxygen  Post-op Assessment: Report given to RN and Post -op Vital signs reviewed and stable  Post vital signs: Reviewed and stable  Last Vitals:  Vitals Value Taken Time  BP 105/67 02/27/20 1132  Temp 36.5 C 02/27/20 1132  Pulse 73 02/27/20 1132  Resp 17 02/27/20 1132  SpO2 90 % 02/27/20 1132    Last Pain:  Vitals:   02/27/20 1132  TempSrc: Oral  PainSc: 0-No pain         Complications: No complications documented.

## 2020-02-28 ENCOUNTER — Encounter (HOSPITAL_COMMUNITY): Payer: Self-pay | Admitting: Internal Medicine

## 2020-02-28 ENCOUNTER — Encounter (HOSPITAL_COMMUNITY)
Admission: EM | Disposition: A | Discharge status: Home Health | Payer: Self-pay | Source: Other Acute Inpatient Hospital | Attending: Family Medicine

## 2020-02-28 HISTORY — PX: GIVENS CAPSULE STUDY: SHX5432

## 2020-02-28 LAB — CBC
HCT: 27.5 % — ABNORMAL LOW (ref 39.0–52.0)
Hemoglobin: 8 g/dL — ABNORMAL LOW (ref 13.0–17.0)
MCH: 27.3 pg (ref 26.0–34.0)
MCHC: 29.1 g/dL — ABNORMAL LOW (ref 30.0–36.0)
MCV: 93.9 fL (ref 80.0–100.0)
Platelets: 232 10*3/uL (ref 150–400)
RBC: 2.93 MIL/uL — ABNORMAL LOW (ref 4.22–5.81)
RDW: 15.7 % — ABNORMAL HIGH (ref 11.5–15.5)
WBC: 6.6 10*3/uL (ref 4.0–10.5)
nRBC: 0 % (ref 0.0–0.2)

## 2020-02-28 LAB — GLUCOSE, CAPILLARY
Glucose-Capillary: 105 mg/dL — ABNORMAL HIGH (ref 70–99)
Glucose-Capillary: 109 mg/dL — ABNORMAL HIGH (ref 70–99)
Glucose-Capillary: 110 mg/dL — ABNORMAL HIGH (ref 70–99)
Glucose-Capillary: 124 mg/dL — ABNORMAL HIGH (ref 70–99)
Glucose-Capillary: 139 mg/dL — ABNORMAL HIGH (ref 70–99)
Glucose-Capillary: 143 mg/dL — ABNORMAL HIGH (ref 70–99)
Glucose-Capillary: 173 mg/dL — ABNORMAL HIGH (ref 70–99)
Glucose-Capillary: 88 mg/dL (ref 70–99)

## 2020-02-28 SURGERY — IMAGING PROCEDURE, GI TRACT, INTRALUMINAL, VIA CAPSULE
Anesthesia: LOCAL

## 2020-02-28 MED ORDER — LEVALBUTEROL HCL 1.25 MG/0.5ML IN NEBU
1.2500 mg | INHALATION_SOLUTION | Freq: Four times a day (QID) | RESPIRATORY_TRACT | Status: DC | PRN
  Administered 2020-02-28 (×3): 1.25 mg via RESPIRATORY_TRACT
  Filled 2020-02-28 (×6): qty 0.5

## 2020-02-28 SURGICAL SUPPLY — 1 items: TOWEL COTTON PACK 4EA (MISCELLANEOUS) ×4 IMPLANT

## 2020-02-28 NOTE — Progress Notes (Signed)
[facility] Progress Note  Devin Holt 74 y.o. 1946/01/19  CC:  Anemia, melena  Subjective: Patient reports feeling well this morning.  He is sleepy but denies abdominal pain, nausea, and vomiting.  Reports his last stool was last night and was brown.  He swallowed the capsule this morning (endoscopy) and has no complaints.    ROS : Review of Systems  Cardiovascular: Negative for chest pain and palpitations.  Gastrointestinal: Negative for abdominal pain, blood in stool, constipation, diarrhea, heartburn, melena, nausea and vomiting.   Objective: Vital signs in last 24 hours: Vitals:   02/28/20 0638 02/28/20 0756  BP: (!) 112/54   Pulse: 74   Resp: 20   Temp: 98.7 F (37.1 C)   SpO2: 94% 92%    Physical Exam:  General:  Sleeping but arouses to voice alone, oriented, cooperative, no distress,elderly  Head:  Normocephalic, without obvious abnormality, atraumatic  Eyes:  Conjunctival pallor, EOMs intact  Lungs:   Clear to auscultation bilaterally, respirations unlabored  Heart:  Regular rate and rhythm, S1/S2 normal  Abdomen:   Soft, non-tender, nondistended, normoactive bowel sounds, no guarding or peritoneal signs  Extremities: Extremities normal, atraumatic, no  edema  Pulses: 2+ and symmetric    Lab Results: No results for input(s): NA, K, CL, CO2, GLUCOSE, BUN, CREATININE, CALCIUM, MG, PHOS in the last 72 hours. No results for input(s): AST, ALT, ALKPHOS, BILITOT, PROT, ALBUMIN in the last 72 hours. Recent Labs    02/27/20 0848 02/28/20 0510  WBC 4.4 6.6  NEUTROABS 2.6  --   HGB 7.8* 8.0*  HCT 26.5* 27.5*  MCV 93.3 93.9  PLT 217 232   No results for input(s): LABPROT, INR in the last 72 hours.  Melena and anemia in the setting of Plavix and aspirin use -Small bowel enteroscopy 6/12 revealed one non-bleeding AVM in the stomach and 5 non-bleeding AVMs in the duodenum treated with APC. -Colonoscopy 6/13 revealed 3 non-bleeding colonic AVMs, treated  with APC, as well as one 6 mm polyp in the transverse colon path pending) -Hgb 8.0 today, stable as compared to 7.8 yesterday  A. Fib: on Eliquis (last dose of Eliquis 6/9 a.m.)  Sick sinus syndrome (s/p pacemaker)  COPD  Plan: Await capsule endoscopy results.  Recommend holding Eliquis for one week.  Defer anticoagulation resumption to cardiology.  Continue Protonix 40mg  BID.  Continue to monitor H&H with transfusion as needed to maintain hemoglobin greater than 7.  [facility] will follow.  Salley Slaughter PA-C 02/28/2020, 9:12 AM  Contact #  [phone]

## 2020-02-28 NOTE — Progress Notes (Signed)
Capsule endoscopy monitoring ended at 2030. Machine at bedside.

## 2020-02-28 NOTE — TOC Initial Note (Signed)
Transition of Care [facility]) - Initial/Assessment Note    Patient Details  Name: Devin Holt MRN: [id number] Date of Birth: [birth year]  Transition of Care [facility]) CM/SW Contact:    Bartholomew Crews, RN Phone Number: [phone] 02/28/2020, 1:57 PM  Clinical Narrative:                  Spoke with patient at the bedside. PTA home with spouse, 2 boys, and grandchildren. Has a walker, cane, shower chair at home. Also has home oxygen from [place]. States that his wife will be visiting this afternoon, and she will pick him up at discharge which he is hopeful for tomorrow. States that he is currently active with [facility] for home health RN and PT. Patient will need [place] orders for RN and PT for resumption of services at discharge. TOC following for transition needs.   Expected Discharge Plan: [place] Barriers to Discharge: Continued Medical Work up   Patient Goals and CMS Choice Patient states their goals for this hospitalization and ongoing recovery are:: return home with family CMS Medicare.gov Compare Post Acute Care list provided to:: Patient Choice offered to / list presented to : Patient  Expected Discharge Plan and Services Expected Discharge Plan: [place] In-house Referral: NA Discharge Planning Services: CM Consult Post Acute Care Choice: [place] arrangements for the past 2 months: Single Family Home                 DME Arranged: N/A DME Agency: NA       HH Arranged: PT, RN [place] Agency: [facility] Date [place]: 02/28/20 Time HH Agency Contacted: 57 Representative spoke with at [place]: [place]  Prior Living Arrangements/Services Living arrangements for the past 2 months: [place] with:: Self, Spouse, Adult Children Patient language and need for interpreter reviewed:: Yes        Need for Family Participation in Patient Care: Yes (Comment) Care giver support system in place?: Yes  (comment) Current home services: DME, Home RN, Home PT (home oxygen, walker, cane, shower chair) Criminal Activity/Legal Involvement Pertinent to Current Situation/Hospitalization: No - Comment as needed  Activities of Daily Living Home Assistive Devices/Equipment: Cane (specify quad or straight) ADL Screening (condition at time of admission) Patient's cognitive ability adequate to safely complete daily activities?: Yes Is the patient deaf or have difficulty hearing?: No Does the patient have difficulty seeing, even when wearing glasses/contacts?: No Does the patient have difficulty concentrating, remembering, or making decisions?: No Patient able to express need for assistance with ADLs?: Yes Does the patient have difficulty dressing or bathing?: No Independently performs ADLs?: Yes (appropriate for developmental age) Does the patient have difficulty walking or climbing stairs?: No Weakness of Legs: Both Weakness of Arms/Hands: None  Permission Sought/Granted                  Emotional Assessment Appearance:: Appears stated age Attitude/Demeanor/Rapport: Engaged Affect (typically observed): Accepting Orientation: : Oriented to Self, Oriented to  Time, Oriented to Place, Oriented to Situation Alcohol / Substance Use: Not Applicable Psych Involvement: No (comment)  Admission diagnosis:  Gastrointestinal hemorrhage with melena [K92.1] Patient Active Problem List   Diagnosis Date Noted  . Angiodysplasia of stomach and duodenum with bleeding   . AVM (arteriovenous malformation) of colon   . PAF (paroxysmal atrial fibrillation) ([place]) 02/24/2020  . CAD (coronary artery disease) 02/24/2020  . COPD (chronic obstructive pulmonary disease) ([place])  02/24/2020  . Chronic respiratory failure with hypoxia and hypercapnia ([place]) 02/24/2020  . DM2 (diabetes mellitus, type 2) ([place]) 02/24/2020  . Acute blood loss anemia 02/24/2020  . Gastrointestinal hemorrhage with melena 02/24/2020   PCP:   Bonnita Nasuti, MD Pharmacy:   [facility] [address], [place] [address] [place] [address] Phone: [phone] Fax: [phone]     Social Determinants of Health (SDOH) Interventions    Readmission Risk Interventions No flowsheet data found.

## 2020-02-28 NOTE — Progress Notes (Signed)
PROGRESS NOTE    Devin Holt  EQA:[id number] DOB: 03-07-1946 DOA: 02/24/2020 PCP: Bonnita Nasuti, MD   Brief Narrative:  Devin Holt is a very pleasant 74 y.o. male with a past medical history that includes COPD, not terminal oxygen, CAD with prior stent 2007, diabetes, GERD, paroxysmal atrial fibrillation, remote CVA, sick sinus syndrome status post pacemaker, hypertension, small bowel and colonic AVMs transferred to [facility] from [place] with GI bleeding and acute blood loss anemia.  Patient was hospitalized last month in [facility] for COPD exacerbation with hypoxia that subsequently precipitated A. fib with RVR.  He was started on Eliquis at that time in spite of his reports that he has a history of GI bleed.  Over the last 2 weeks he developed melena and gradual worsening of generalized weakness.  Saw his PCP lab work revealed a hemoglobin of 4.6.  He went to [facility] and work-up revealed a hemoglobin of 5.5.  He was transfused 2 units packed red blood cells and transferred to [facility] for GI evaluation.  He has had 1 stool since he arrived and his hemoglobin is 7.7.  Patient underwent EGD on 02/26/2020 which did not reveal any source of bleeding.  He then underwent colonoscopy on 02/27/2020 which showed a small polyp in transverse colon which was resected.  3 small angiectasia's which were treated with APC.  Assessment & Plan:   Principal Problem:   Gastrointestinal hemorrhage with melena Active Problems:   PAF (paroxysmal atrial fibrillation) (HCC)   CAD (coronary artery disease)   COPD (chronic obstructive pulmonary disease) (HCC)   Chronic respiratory failure with hypoxia and hypercapnia (HCC)   DM2 (diabetes mellitus, type 2) (HCC)   Acute blood loss anemia   Angiodysplasia of stomach and duodenum with bleeding   AVM (arteriovenous malformation) of colon   #1.  GI bleed with melena/acute loss anemia: Hemoglobin 5.6.  Received 2 units of PRBC transfusion at  [facility] before discharge.  Posttransfusion hemoglobin 7.6 and has remained over 7 and stable since last 4 days without any further requirement for transfusion.  He remains on Protonix.  GI consulted.  Status post EGD 02/26/2020 which did not reveal any source of bleeding.  He then underwent colonoscopy on 02/27/2020 which showed a polyp in the transverse colon and 3 AVMs treated with APC.  GI recommends capsule endoscopy.  He has swallowed the capsule today.  It is a 12-hour study.  Report will be available tomorrow per GI.  Patient remains in the hospital overnight.  #2. PAF/hx SSS s/p pacemaker. Recently placed on eliquis.  Home medications include metoprolol, Cardizem, amiodarone. -Continue home meds except Eliquis.  Rates controlled.  #3.  COPD.  Wears oxygen at night.  Continue home medications.  #4.  Diabetes type 2.  Home medications include Metformin.  Serum glucose 97. -Hold Metformin for now.  Continue SSI.  #5.  CAD.  History of CAD with prior stent to LAD and RCA in 2007.  No chest pain.  Home medications include aspirin, fenofibrate, pravastatin -Holding home meds for now   DVT prophylaxis: SCDs Start: 02/24/20 2134   Code Status: Full Code  Family Communication:  None present at bedside.  Plan of care discussed with patient in length and he verbalized understanding and agreed with it.  Status is: Inpatient  Remains inpatient appropriate because:Ongoing diagnostic testing needed not appropriate for outpatient work up   Dispo:  Patient From: Home  Planned Disposition: Home  Expected discharge  date: 1 day  Medically stable for discharge: No         Estimated body mass index is 25.51 kg/m as calculated from the following:   Height as of this encounter: 5\' 8"  (1.727 m).   Weight as of this encounter: 76.1 kg.      Nutritional status:               Consultants:   GI  Procedures:   EGD 02/26/2020  Colonoscopy  02/27/2020  Antimicrobials:  Anti-infectives (From admission, onward)   None         Subjective: Seen and examined.  No complaints.  Objective: Vitals:   02/28/20 0638 02/28/20 0756 02/28/20 0823 02/28/20 0844  BP: (!) 112/54  (!) 116/59   Pulse: 74  77   Resp: 20  18   Temp: 98.7 F (37.1 C)  98 F (36.7 C)   TempSrc: Oral  Oral   SpO2: 94% 92% 94%   Weight:    76.1 kg  Height:    5\' 8"  (1.727 m)    Intake/Output Summary (Last 24 hours) at 02/28/2020 1258 Last data filed at 02/28/2020 0834 Gross per 24 hour  Intake 942.96 ml  Output 0 ml  Net 942.96 ml   Filed Weights   02/27/20 1015 02/27/20 2009 02/28/20 0844  Weight: 72.7 kg 76.1 kg 76.1 kg    Examination:  General exam: Appears calm and comfortable  Respiratory system: Clear to auscultation. Respiratory effort normal. Cardiovascular system: S1 & S2 heard, irregularly irregular rate and rhythm. No JVD, murmurs, rubs, gallops or clicks. No pedal edema. Gastrointestinal system: Abdomen is nondistended, soft and nontender. No organomegaly or masses felt. Normal bowel sounds heard. Central nervous system: Alert and oriented. No focal neurological deficits. Extremities: Symmetric 5 x 5 power. Skin: No rashes, lesions or ulcers.  Psychiatry: Judgement and insight appear normal. Mood & affect appropriate.   Data Reviewed: I have personally reviewed following labs and imaging studies  CBC: Recent Labs  Lab 02/25/20 0500 02/25/20 1353 02/26/20 0720 02/27/20 0848 02/28/20 0510  WBC 4.6 5.6 5.4 4.4 6.6  NEUTROABS  --   --   --  2.6  --   HGB 7.7* 8.2* 8.2* 7.8* 8.0*  HCT 25.4* 27.1* 27.4* 26.5* 27.5*  MCV 92.4 91.9 92.6 93.3 93.9  PLT 219 236 252 217 607   Basic Metabolic Panel: Recent Labs  Lab 02/24/20 2151 02/25/20 0500  NA 138 136  K 4.2 4.2  CL 104 104  CO2 26 27  GLUCOSE 91 97  BUN 24* 21  CREATININE 1.20 1.20  CALCIUM 8.7* 8.8*   GFR: Estimated Creatinine Clearance: 53 mL/min (by C-G  formula based on SCr of 1.2 mg/dL). Liver Function Tests: Recent Labs  Lab 02/24/20 2151  AST 14*  ALT 10  ALKPHOS 43  BILITOT 0.8  PROT 5.7*  ALBUMIN 3.3*   No results for input(s): LIPASE, AMYLASE in the last 168 hours. No results for input(s): AMMONIA in the last 168 hours. Coagulation Profile: No results for input(s): INR, PROTIME in the last 168 hours. Cardiac Enzymes: No results for input(s): CKTOTAL, CKMB, CKMBINDEX, TROPONINI in the last 168 hours. BNP (last 3 results) No results for input(s): PROBNP in the last 8760 hours. HbA1C: No results for input(s): HGBA1C in the last 72 hours. CBG: Recent Labs  Lab 02/28/20 0003 02/28/20 0324 02/28/20 0635 02/28/20 0712 02/28/20 1102  GLUCAP 139* 110* 109* 124* 105*   Lipid Profile: No results for  input(s): CHOL, HDL, LDLCALC, TRIG, CHOLHDL, LDLDIRECT in the last 72 hours. Thyroid Function Tests: No results for input(s): TSH, T4TOTAL, FREET4, T3FREE, THYROIDAB in the last 72 hours. Anemia Panel: No results for input(s): VITAMINB12, FOLATE, FERRITIN, TIBC, IRON, RETICCTPCT in the last 72 hours. Sepsis Labs: No results for input(s): PROCALCITON, LATICACIDVEN in the last 168 hours.  No results found for this or any previous visit (from the past 240 hour(s)).    Radiology Studies: No results found.  Scheduled Meds: . amiodarone  200 mg Oral BID  . diltiazem  240 mg Oral Daily  . insulin aspart  0-9 Units Subcutaneous Q4H  . levalbuterol  1.25 mg Nebulization BID  . metoprolol tartrate  50 mg Oral BID  . pantoprazole  40 mg Intravenous Q12H   Continuous Infusions:    LOS: 4 days   Time spent: 26 minutes   Darliss Cheney, MD [facility]  02/28/2020, 12:58 PM   To contact the attending provider between 7A-7P or the covering provider during after hours 7P-7A, please log into the web site [email or web address].

## 2020-02-29 ENCOUNTER — Encounter (HOSPITAL_COMMUNITY): Payer: Self-pay | Admitting: Gastroenterology

## 2020-02-29 ENCOUNTER — Inpatient Hospital Stay (HOSPITAL_COMMUNITY): Payer: Medicare HMO

## 2020-02-29 DIAGNOSIS — J9612 Chronic respiratory failure with hypercapnia: Secondary | ICD-10-CM

## 2020-02-29 DIAGNOSIS — J9611 Chronic respiratory failure with hypoxia: Secondary | ICD-10-CM

## 2020-02-29 DIAGNOSIS — J439 Emphysema, unspecified: Secondary | ICD-10-CM

## 2020-02-29 DIAGNOSIS — J9622 Acute and chronic respiratory failure with hypercapnia: Secondary | ICD-10-CM

## 2020-02-29 DIAGNOSIS — J9621 Acute and chronic respiratory failure with hypoxia: Secondary | ICD-10-CM

## 2020-02-29 DIAGNOSIS — J9601 Acute respiratory failure with hypoxia: Secondary | ICD-10-CM

## 2020-02-29 DIAGNOSIS — J9602 Acute respiratory failure with hypercapnia: Secondary | ICD-10-CM

## 2020-02-29 DIAGNOSIS — K921 Melena: Secondary | ICD-10-CM

## 2020-02-29 LAB — POCT I-STAT 7, (LYTES, BLD GAS, ICA,H+H)
Acid-base deficit: 2 mmol/L (ref 0.0–2.0)
Acid-base deficit: 1 mmol/L (ref 0.0–2.0)
Bicarbonate: 28.9 mmol/L — ABNORMAL HIGH (ref 20.0–28.0)
Bicarbonate: 24.7 mmol/L (ref 20.0–28.0)
Calcium, Ion: 1.26 mmol/L (ref 1.15–1.40)
Calcium, Ion: 1.25 mmol/L (ref 1.15–1.40)
HCT: 27 % — ABNORMAL LOW (ref 39.0–52.0)
HCT: 23 % — ABNORMAL LOW (ref 39.0–52.0)
Hemoglobin: 9.2 g/dL — ABNORMAL LOW (ref 13.0–17.0)
Hemoglobin: 7.8 g/dL — ABNORMAL LOW (ref 13.0–17.0)
O2 Saturation: 100 %
O2 Saturation: 94 %
Patient temperature: 98.7
Patient temperature: 98.6
Potassium: 4.8 mmol/L (ref 3.5–5.1)
Potassium: 4.5 mmol/L (ref 3.5–5.1)
Sodium: 134 mmol/L — ABNORMAL LOW (ref 135–145)
Sodium: 136 mmol/L (ref 135–145)
TCO2: 32 mmol/L (ref 22–32)
TCO2: 26 mmol/L (ref 22–32)
pCO2 arterial: 95.8 mmHg (ref 32.0–48.0)
pCO2 arterial: 46.1 mmHg (ref 32.0–48.0)
pH, Arterial: 7.089 — CL (ref 7.350–7.450)
pH, Arterial: 7.336 — ABNORMAL LOW (ref 7.350–7.450)
pO2, Arterial: 398 mmHg — ABNORMAL HIGH (ref 83.0–108.0)
pO2, Arterial: 75 mmHg — ABNORMAL LOW (ref 83.0–108.0)

## 2020-02-29 LAB — CBC WITH DIFFERENTIAL/PLATELET
Abs Immature Granulocytes: 0.03 10*3/uL (ref 0.00–0.07)
Basophils Absolute: 0 10*3/uL (ref 0.0–0.1)
Basophils Relative: 0 %
Eosinophils Absolute: 0.2 10*3/uL (ref 0.0–0.5)
Eosinophils Relative: 3 %
HCT: 27.8 % — ABNORMAL LOW (ref 39.0–52.0)
Hemoglobin: 8 g/dL — ABNORMAL LOW (ref 13.0–17.0)
Immature Granulocytes: 0 %
Lymphocytes Relative: 9 %
Lymphs Abs: 0.7 10*3/uL (ref 0.7–4.0)
MCH: 26.8 pg (ref 26.0–34.0)
MCHC: 28.8 g/dL — ABNORMAL LOW (ref 30.0–36.0)
MCV: 93.3 fL (ref 80.0–100.0)
Monocytes Absolute: 0.8 10*3/uL (ref 0.1–1.0)
Monocytes Relative: 11 %
Neutro Abs: 5.9 10*3/uL (ref 1.7–7.7)
Neutrophils Relative %: 77 %
Platelets: 250 10*3/uL (ref 150–400)
RBC: 2.98 MIL/uL — ABNORMAL LOW (ref 4.22–5.81)
RDW: 15.9 % — ABNORMAL HIGH (ref 11.5–15.5)
WBC: 7.7 10*3/uL (ref 4.0–10.5)
nRBC: 0 % (ref 0.0–0.2)

## 2020-02-29 LAB — LACTIC ACID, PLASMA: Lactic Acid, Venous: 1.5 mmol/L (ref 0.5–1.9)

## 2020-02-29 LAB — APTT: aPTT: 21 seconds — ABNORMAL LOW (ref 24–36)

## 2020-02-29 LAB — POCT I-STAT EG7
Acid-Base Excess: 0 mmol/L (ref 0.0–2.0)
Bicarbonate: 24.8 mmol/L (ref 20.0–28.0)
Calcium, Ion: 1.17 mmol/L (ref 1.15–1.40)
HCT: 25 % — ABNORMAL LOW (ref 39.0–52.0)
Hemoglobin: 8.5 g/dL — ABNORMAL LOW (ref 13.0–17.0)
O2 Saturation: 71 %
Patient temperature: 98
Potassium: 4.4 mmol/L (ref 3.5–5.1)
Sodium: 133 mmol/L — ABNORMAL LOW (ref 135–145)
TCO2: 26 mmol/L (ref 22–32)
pCO2, Ven: 38.6 mmHg — ABNORMAL LOW (ref 44.0–60.0)
pH, Ven: 7.416 (ref 7.250–7.430)
pO2, Ven: 36 mmHg (ref 32.0–45.0)

## 2020-02-29 LAB — CBC
HCT: 30.1 % — ABNORMAL LOW (ref 39.0–52.0)
Hemoglobin: 8.7 g/dL — ABNORMAL LOW (ref 13.0–17.0)
MCH: 27.3 pg (ref 26.0–34.0)
MCHC: 28.9 g/dL — ABNORMAL LOW (ref 30.0–36.0)
MCV: 94.4 fL (ref 80.0–100.0)
Platelets: 211 10*3/uL (ref 150–400)
RBC: 3.19 MIL/uL — ABNORMAL LOW (ref 4.22–5.81)
RDW: 15.7 % — ABNORMAL HIGH (ref 11.5–15.5)
WBC: 7.3 10*3/uL (ref 4.0–10.5)
nRBC: 0 % (ref 0.0–0.2)

## 2020-02-29 LAB — BASIC METABOLIC PANEL
Anion gap: 10 (ref 5–15)
Anion gap: 11 (ref 5–15)
BUN: 18 mg/dL (ref 8–23)
BUN: 21 mg/dL (ref 8–23)
CO2: 25 mmol/L (ref 22–32)
CO2: 22 mmol/L (ref 22–32)
Calcium: 8.9 mg/dL (ref 8.9–10.3)
Calcium: 8.5 mg/dL — ABNORMAL LOW (ref 8.9–10.3)
Chloride: 100 mmol/L (ref 98–111)
Chloride: 101 mmol/L (ref 98–111)
Creatinine, Ser: 1.79 mg/dL — ABNORMAL HIGH (ref 0.61–1.24)
Creatinine, Ser: 1.86 mg/dL — ABNORMAL HIGH (ref 0.61–1.24)
GFR calc Af Amer: 43 mL/min — ABNORMAL LOW (ref >60)
GFR calc Af Amer: 41 mL/min — ABNORMAL LOW (ref >60)
GFR calc non Af Amer: 37 mL/min — ABNORMAL LOW (ref >60)
GFR calc non Af Amer: 35 mL/min — ABNORMAL LOW (ref >60)
Glucose, Bld: 125 mg/dL — ABNORMAL HIGH (ref 70–99)
Glucose, Bld: 161 mg/dL — ABNORMAL HIGH (ref 70–99)
Potassium: 4.9 mmol/L (ref 3.5–5.1)
Potassium: 5.3 mmol/L — ABNORMAL HIGH (ref 3.5–5.1)
Sodium: 135 mmol/L (ref 135–145)
Sodium: 134 mmol/L — ABNORMAL LOW (ref 135–145)

## 2020-02-29 LAB — GLUCOSE, CAPILLARY
Glucose-Capillary: 100 mg/dL — ABNORMAL HIGH (ref 70–99)
Glucose-Capillary: 117 mg/dL — ABNORMAL HIGH (ref 70–99)
Glucose-Capillary: 117 mg/dL — ABNORMAL HIGH (ref 70–99)
Glucose-Capillary: 125 mg/dL — ABNORMAL HIGH (ref 70–99)
Glucose-Capillary: 128 mg/dL — ABNORMAL HIGH (ref 70–99)
Glucose-Capillary: 191 mg/dL — ABNORMAL HIGH (ref 70–99)
Glucose-Capillary: 81 mg/dL (ref 70–99)
Glucose-Capillary: 99 mg/dL (ref 70–99)

## 2020-02-29 LAB — PROTIME-INR
INR: 1.1 (ref 0.8–1.2)
Prothrombin Time: 13.9 seconds (ref 11.4–15.2)

## 2020-02-29 LAB — MAGNESIUM: Magnesium: 1.9 mg/dL (ref 1.7–2.4)

## 2020-02-29 LAB — MRSA PCR SCREENING: MRSA by PCR: NEGATIVE

## 2020-02-29 LAB — SURGICAL PATHOLOGY

## 2020-02-29 MED ORDER — FENTANYL CITRATE (PF) 100 MCG/2ML IJ SOLN: 25.0000 ug | INTRAMUSCULAR | Status: DC | PRN

## 2020-02-29 MED ORDER — SODIUM CHLORIDE 0.9 % IV SOLN
INTRAVENOUS | Status: DC | PRN
  Administered 2020-02-29: 1000 mL via INTRAVENOUS

## 2020-02-29 MED ORDER — MIDAZOLAM HCL 2 MG/2ML IJ SOLN
INTRAMUSCULAR | Status: AC
  Filled 2020-02-29: qty 4

## 2020-02-29 MED ORDER — FENTANYL CITRATE (PF) 100 MCG/2ML IJ SOLN
INTRAMUSCULAR | Status: AC
  Filled 2020-02-29: qty 2

## 2020-02-29 MED ORDER — CHLORHEXIDINE GLUCONATE 0.12% ORAL RINSE (MEDLINE KIT)
15.0000 mL | Freq: Two times a day (BID) | OROMUCOSAL | Status: DC
  Administered 2020-02-29 – 2020-03-02 (×5): 15 mL via OROMUCOSAL

## 2020-02-29 MED ORDER — ARFORMOTEROL TARTRATE 15 MCG/2ML IN NEBU
15.0000 ug | INHALATION_SOLUTION | Freq: Two times a day (BID) | RESPIRATORY_TRACT | Status: DC
  Administered 2020-02-29 – 2020-03-01 (×3): 15 ug via RESPIRATORY_TRACT
  Filled 2020-02-29 (×5): qty 2

## 2020-02-29 MED ORDER — LEVALBUTEROL HCL 1.25 MG/0.5ML IN NEBU
1.2500 mg | INHALATION_SOLUTION | RESPIRATORY_TRACT | Status: DC | PRN
  Administered 2020-02-29: 1.25 mg via RESPIRATORY_TRACT
  Filled 2020-02-29 (×2): qty 0.5

## 2020-02-29 MED ORDER — BUDESONIDE 0.25 MG/2ML IN SUSP
0.2500 mg | Freq: Two times a day (BID) | RESPIRATORY_TRACT | Status: DC
  Administered 2020-02-29 – 2020-03-01 (×3): 0.25 mg via RESPIRATORY_TRACT
  Filled 2020-02-29 (×2): qty 2

## 2020-02-29 MED ORDER — CHLORHEXIDINE GLUCONATE CLOTH 2 % EX PADS
6.0000 | MEDICATED_PAD | Freq: Every day | CUTANEOUS | Status: DC
  Administered 2020-02-29 – 2020-03-05 (×6): 6 via TOPICAL

## 2020-02-29 MED ORDER — REVEFENACIN 175 MCG/3ML IN SOLN
175.0000 ug | Freq: Every day | RESPIRATORY_TRACT | Status: DC
  Administered 2020-02-29 – 2020-03-03 (×4): 175 ug via RESPIRATORY_TRACT
  Filled 2020-02-29 (×4): qty 3

## 2020-02-29 MED ORDER — PROPOFOL 1000 MG/100ML IV EMUL
0.0000 ug/kg/min | INTRAVENOUS | Status: DC
  Administered 2020-02-29 – 2020-03-01 (×2): 5 ug/kg/min via INTRAVENOUS
  Filled 2020-02-29 (×2): qty 100

## 2020-02-29 MED ORDER — ORAL CARE MOUTH RINSE
15.0000 mL | OROMUCOSAL | Status: DC
  Administered 2020-02-29 – 2020-03-01 (×12): 15 mL via OROMUCOSAL

## 2020-02-29 MED ORDER — DOCUSATE SODIUM 50 MG/5ML PO LIQD
100.0000 mg | Freq: Two times a day (BID) | ORAL | Status: DC
  Administered 2020-02-29 – 2020-03-01 (×3): 100 mg via ORAL
  Filled 2020-02-29 (×3): qty 10

## 2020-02-29 MED ORDER — POLYETHYLENE GLYCOL 3350 17 G PO PACK
17.0000 g | PACK | Freq: Every day | ORAL | Status: DC
  Administered 2020-02-29 – 2020-03-01 (×2): 17 g via ORAL
  Filled 2020-02-29 (×2): qty 1

## 2020-02-29 MED FILL — Medication: Qty: 1 | Status: AC

## 2020-02-29 NOTE — Progress Notes (Signed)
Entered room found patient who obviously had been sitting on side of bed getting breathing treatment lying face down on bed, drooling and unresponsive. His eyes were open, respirations agonal and radial pulse strong and regular. I called for help and we repositioned patient, rapid response was called as was a code blue. Oxygen placed, sats 87%, HR afib at 108, respirations remained agonal and patient remained non-responsive. He was intubated by pulmonary/CC and transferred to ICU.    Nursing supervisor spoke to wife with update.   Santiago Glad m. Renesme Kerrigan, NP

## 2020-02-29 NOTE — Progress Notes (Signed)
RT NOTES: ABG pH 7.089/PCO2 95.5/PO2 397/HCO3 28.9 Results given to [facility] Minor NP. Order to increase rate to 20 and drop fiO2 to 50%.

## 2020-02-29 NOTE — Plan of Care (Signed)
  Problem: Clinical Measurements: Goal: Ability to maintain clinical measurements within normal limits will improve Outcome: Completed/Met Goal: Will remain free from infection Outcome: Completed/Met Goal: Diagnostic test results will improve Outcome: Completed/Met Goal: Cardiovascular complication will be avoided Outcome: Completed/Met   Problem: Activity: Goal: Risk for activity intolerance will decrease Outcome: Completed/Met   Problem: Coping: Goal: Level of anxiety will decrease Outcome: Completed/Met   Problem: Elimination: Goal: Will not experience complications related to bowel motility Outcome: Completed/Met Goal: Will not experience complications related to urinary retention Outcome: Completed/Met

## 2020-02-29 NOTE — Progress Notes (Signed)
Arrived to code blue, pt had 2 patent PIV sites. Team dismissed IV therapy.

## 2020-02-29 NOTE — Consult Note (Addendum)
NAME:  Devin Holt, MRN:  [id number], DOB:  1946-02-01, LOS: 5 ADMISSION DATE:  02/24/2020, CONSULTATION DATE: 02/29/2020 REFERRING MD: [facility]ad, CHIEF COMPLAINT: Respiratory arrest  Brief History   74 year old known COPD, atrial fibrillation GI bleed found to have agonal respirations intubated transfer to intensive care unit on 02/29/2020  History of present illness   74 year old male with known COPD, atrial fibrillation on anticoagulation, coronary artery disease, diabetes and has a history of duodenal AVMs and colonic AVMs and reported to [facility] 02/27/2019 several days of feeling weak without any evidence of GI bleed at that time.  Lab work-up revealed a hemoglobin of 5.5 at which time he was transferred to [facility].  02/27/2020 he underwent colonoscopy per Dr. Cristina Gong in [place] coagulation of AVMs was performed.  Is been scheduled for a capsule but on 02/29/2020 he is found to have agonal breathing but did not lose pulse CODE BLUE was called.  Pulmonary critical care to the bedside intubated him transferred to the intensive care unit.  pH was noted to be 7.08 PCO2 noted to be 95 despite being intubated mechanically ventilatory supported for over 20 minutes.  Chest x-ray revealed changes of chronic focal pulmonary disease with proper placement of endotracheal tube.  He has been transferred to the intensive care unit and to the pulmonary critical care service.  CT of the head was not needed as he is able to follow commands and move all extremities.  Most likely his altered mental status was secondary to elevated carbon dioxide levels.  Stat CBC has been sent to evaluate for possible continued GI bleed.  He will be monitored in intensive care unit for further evaluation and treatment  Past Medical History  GI bleed Atrial fibrillation Coronary artery disease Chronic obstructive pulmonary disease Diabetes  Significant Hospital Events   11/01/2019, balloon  Consults:  GI  service Care plan  Procedures:  02/27/2020 colonoscopy 02/29/2020 intubation bedside during CODE BLUE  Significant Diagnostic Tests:    Micro Data:    Antimicrobials:    Interim history/subjective:  74 year old male with history of GI bleed significant COPD had agonal breathing CODE BLUE was called was intubated at the bedside per pulmonary critical care noted to have a PCO2 of 95 despite being intubated and mechanical dilatory support  Objective   Blood pressure (!) 111/58, pulse 81, temperature (!) 97.4 F (36.3 C), temperature source Axillary, resp. rate 20, height 5\' 8"  (1.727 m), weight 78.5 kg, SpO2 (!) 71 %.    FiO2 (%):  [50 %] 50 % Set Rate:  [20 bmp] 20 bmp Vt Set:  [540 mL] 540 mL PEEP:  [5 cmH20] 5 cmH20 Plateau Pressure:  [22 cmH20] 22 cmH20   Intake/Output Summary (Last 24 hours) at 02/29/2020 0917 Last data filed at 02/29/2020 0600 Gross per 24 hour  Intake 720 ml  Output 0 ml  Net 720 ml   Filed Weights   02/28/20 0844 02/28/20 2002 02/29/20 0900  Weight: 76.1 kg 73.3 kg 78.5 kg    Examination: General: Frail elderly male who is intubated HENT: No JVD or lymphadenopathy is appreciated Lungs: Coarse rhonchi bilaterally diminished in the bases Cardiovascular: Heart sounds are irregular Abdomen: Mildly distended nontender to palpation Extremities: Warm and dry Neuro: Despite sedation is able to follow simple commands moves all extremities  Resolved Hospital Problem list     Assessment & Plan:  Vent dependent respiratory failure in the setting of hypercarbia in respiratory arrest without loss of pulse.  Noted to have severe COPD chest x-ray and history of tobacco abuse Transfer the intensive care unit Full mechanical ventilatory support Sedation protocol Wean from ventilator as tolerated note that PCO2 was 95 despite being on full mechanical ventilatory support Bronchodilators  Recent GI bleed with interventional colonoscopy per GI services on  02/27/2019 noted to have hemoglobin of 5.5 prior to transfer from [facility]. Serial CBC Monitor for any evidence of bleeding GI is following Hold anticoagulation at this time in the setting of atrial fibrillation  History of atrial fibrillation, history of coronary artery disease. Holding anticoagulation at this time Monitor for any repercussions of atrial fibrillation Continue p.o. amiodarone p.o. Cardizem and beta-blocker   Altered mental status noted to have a PCO2 of 95 despite being on full mechanical ventilatory support which is most likely the cause of his altered mental status. No CT of the head at this time as he is following commands Continue to monitor   History of diabetes Sliding scale insulin protocol  Best practice:  Diet: NPO Pain/Anxiety/Delirium protocol (if indicated): Goal -2 sedation protocol VAP protocol (if indicated): In place DVT prophylaxis: Compression stockings GI prophylaxis: PPI Glucose control: Sliding scale insulin protocol Mobility: Bedrest Code Status: Full Family Communication: No family at bedside Disposition: Transfer to intensive care unit following respiratory arrest requiring intubation  Labs   CBC: Recent Labs  Lab 02/25/20 1353 02/25/20 1353 02/26/20 0720 02/27/20 0848 02/28/20 0510 02/29/20 0543 02/29/20 0856  WBC 5.6  --  5.4 4.4 6.6 7.7  --   NEUTROABS  --   --   --  2.6  --  5.9  --   HGB 8.2*   < > 8.2* 7.8* 8.0* 8.0* 9.2*  HCT 27.1*   < > 27.4* 26.5* 27.5* 27.8* 27.0*  MCV 91.9  --  92.6 93.3 93.9 93.3  --   PLT 236  --  252 217 232 250  --    < > = values in this interval not displayed.    Basic Metabolic Panel: Recent Labs  Lab 02/24/20 2151 02/25/20 0500 02/29/20 0543 02/29/20 0856  NA 138 136 135 134*  K 4.2 4.2 4.9 4.8  CL 104 104 100  --   CO2 26 27 25   --   GLUCOSE 91 97 125*  --   BUN 24* 21 18  --   CREATININE 1.20 1.20 1.79*  --   CALCIUM 8.7* 8.8* 8.9  --    GFR: Estimated Creatinine  Clearance: 35.6 mL/min (A) (by C-G formula based on SCr of 1.79 mg/dL (H)). Recent Labs  Lab 02/26/20 0720 02/27/20 0848 02/28/20 0510 02/29/20 0543  WBC 5.4 4.4 6.6 7.7    Liver Function Tests: Recent Labs  Lab 02/24/20 2151  AST 14*  ALT 10  ALKPHOS 43  BILITOT 0.8  PROT 5.7*  ALBUMIN 3.3*   No results for input(s): LIPASE, AMYLASE in the last 168 hours. No results for input(s): AMMONIA in the last 168 hours.  ABG    Component Value Date/Time   PHART 7.089 (LL) 02/29/2020 0856   PCO2ART 95.8 (HH) 02/29/2020 0856   PO2ART 398 (H) 02/29/2020 0856   HCO3 28.9 (H) 02/29/2020 0856   TCO2 32 02/29/2020 0856   ACIDBASEDEF 2.0 02/29/2020 0856   O2SAT 100.0 02/29/2020 0856     Coagulation Profile: No results for input(s): INR, PROTIME in the last 168 hours.  Cardiac Enzymes: No results for input(s): CKTOTAL, CKMB, CKMBINDEX, TROPONINI in the last 168 hours.  HbA1C: Hgb A1c MFr Bld  Date/Time Value Ref Range Status  02/24/2020 09:51 PM 5.8 (H) 4.8 - 5.6 % Final    Comment:    (NOTE) Pre diabetes:          5.7%-6.4%  Diabetes:              >6.4%  Glycemic control for   <7.0% adults with diabetes     CBG: Recent Labs  Lab 02/28/20 1630 02/28/20 2003 02/29/20 0002 02/29/20 0401 02/29/20 0710  GLUCAP 173* 143* 125* 117* 128*    Review of Systems:   na  Past Medical History  He,  has a past medical history of A-fib ([place]), Angiodysplasia of stomach and duodenum with bleeding, AVM (arteriovenous malformation) of colon, CAD (coronary artery disease), CHF (congestive heart failure) ([place]), CVA (cerebral vascular accident) ([place]), DM2 (diabetes mellitus, type 2) ([place]), Emphysema of lung ([place]), GI bleed, Iron deficiency anemia, and Myocardial infarct ([place]).   Surgical History    Past Surgical History:  Procedure Laterality Date  . ANGIOPLASTY    . CARDIAC CATHETERIZATION    . COLONOSCOPY W/ POLYPECTOMY    . COLONOSCOPY WITH PROPOFOL N/A 02/27/2020    Procedure: COLONOSCOPY WITH PROPOFOL;  Surgeon: Ronald Lobo, MD;  Location: [place];  Service: Endoscopy;  Laterality: N/A;  . CORONARY STENT PLACEMENT     3-4  . ENTEROSCOPY N/A 02/26/2020   Procedure: ENTEROSCOPY;  Surgeon: Ronald Lobo, MD;  Location: [place];  Service: Endoscopy;  Laterality: N/A;  . HOT HEMOSTASIS N/A 02/26/2020   Procedure: HOT HEMOSTASIS (ARGON PLASMA COAGULATION/BICAP);  Surgeon: Ronald Lobo, MD;  Location[facility] ENDOSCOPY;  Service: Endoscopy;  Laterality: N/A;  . HOT HEMOSTASIS N/A 02/27/2020   Procedure: HOT HEMOSTASIS (ARGON PLASMA COAGULATION/BICAP);  Surgeon: Ronald Lobo, MD;  Location[facility] ENDOSCOPY;  Service: Endoscopy;  Laterality: N/A;  . OTHER SURGICAL HISTORY     2 aortic stents  . PACEMAKER IMPLANT    . POLYPECTOMY  02/27/2020   Procedure: POLYPECTOMY;  Surgeon: Ronald Lobo, MD;  Location: [facility] ENDOSCOPY;  Service: Endoscopy;;     Social History   reports that he has quit smoking. He has never used smokeless tobacco. He reports that he does not drink alcohol and does not use drugs.   Family History   His family history includes Colon cancer in his mother and sister; Hypertension in his father; Stroke in his father.   Allergies Allergies  Allergen Reactions  . Isosorbide Other (See Comments)    "Made me feel weak"     Home Medications  Prior to Admission medications   Medication Sig Start Date End Date Taking? Authorizing Provider  amiodarone (PACERONE) 200 MG tablet Take 200 mg by mouth 2 (two) times daily.   Yes [provider]  apixaban (ELIQUIS) 5 MG TABS tablet Take 5 mg by mouth 2 (two) times daily.   Yes [provider]  aspirin 81 MG EC tablet Take 81 mg by mouth daily. 02/08/20 03/09/20 Yes [provider]  diltiazem (CARDIZEM CD) 240 MG 24 hr capsule Take 240 mg by mouth daily. 02/07/20  Yes [provider]  famotidine (PEPCID) 20 MG tablet Take 20 mg by mouth 2 (two) times daily.  02/08/20  Yes [provider]  fenofibrate 54 MG tablet Take 54 mg by mouth daily. 02/07/20  Yes [provider]  Fluticasone-Umeclidin-Vilant (TRELEGY ELLIPTA) 100-62.5-25 MCG/INH AEPB Inhale 1 puff into the lungs daily.   Yes [provider]  levalbuterol Penne Lash) 1.25  MG/3ML nebulizer solution Take 1.25 mg by nebulization See admin instructions. Inhale contents of one vial (1.25 mg) via nebulization every morning, may also use every 8 hours as needed for shortness of breath/wheezing 02/01/20  Yes [provider]  metFORMIN (GLUCOPHAGE) 500 MG tablet Take 500 mg by mouth daily. 02/23/20  Yes [provider]  metoprolol tartrate (LOPRESSOR) 50 MG tablet Take 50 mg by mouth 2 (two) times daily. 01/17/20  Yes [provider]  nitroGLYCERIN (NITROSTAT) 0.3 MG SL tablet Place 0.3 mg under the tongue every 5 (five) minutes as needed for chest pain. Max of 3 tablets daily 01/12/20  Yes [provider]  omeprazole (PRILOSEC) 20 MG capsule Take 20 mg by mouth daily. 02/23/20  Yes [provider]  OXYGEN Inhale 2 L into the lungs as needed (shortness of breath).   Yes [provider]  pravastatin (PRAVACHOL) 40 MG tablet Take 40 mg by mouth daily.  02/23/20  Yes [provider]  tamsulosin (FLOMAX) 0.4 MG CAPS capsule Take 0.4 mg by mouth daily. 01/31/20  Yes [provider]     Critical care time: 94 min    Richardson Landry Minor ACNP Acute Care Nurse Practitioner [place] Please consult Amion 02/29/2020, 9:17 AM   Pulmonary critical care attending:  74 year old known COPD, A. fib on anticoagulation, coronary disease diabetes history of duodenal AVMs.  Admitted for GI bleeding.  Patient had respiratory arrest on the floor was minimally responsive.  Decision made for endotracheal intubation and transferred to the intensive care unit.  Found to have severe hypercarbic respiratory failure.  Evidence of  COPD on chest film and not longstanding history of tobacco use.  BP (!) 111/58   Pulse 81   Temp (!) 97.4 F (36.3 C) (Axillary)   Resp 20   Ht 5\' 8"  (1.727 m)   Wt 78.5 kg   SpO2 (!) 71%   BMI 26.31 kg/m   General: Elderly male, diaphoretic, unresponsive Neuro: Response to pain, withdrawal, GCS 7, prior to intubation Heart: Regular rate rhythm S1-S2 Lungs: Diminished bilaterally, some rhonchi anteriorly, diminished in the base right worse than left  Labs: Reviewed hemoglobin stable Chest x-ray: Reviewed post intubation appropriate endotracheal tube placement bilateral effusions  Assessment: Acute hypoxemic hypercarbic respiratory failure CO2 narcosis, PCO2 95 Acute respiratory acidosis Hypoventilation COPD baseline Elevated serum creatinine GI bleeding, hemoglobin stable, history of AVM  Plan: Patient transferred to the intensive care unit following intubation Treatment for hypercapnic failure. PAD protocol for sedation Scheduled bronchodilators Brovana, Pulmicort Yupelri. As needed Xopenex  This patient is critically ill with multiple organ system failure; which, requires frequent high complexity decision making, assessment, support, evaluation, and titration of therapies. This was completed through the application of advanced monitoring technologies and extensive interpretation of multiple databases. During this encounter critical care time was devoted to patient care services described in this note for 33 minutes.  [place] [facility] 02/29/2020 4:54 PM

## 2020-02-29 NOTE — Procedures (Signed)
Intubation Procedure Note ZALEN SEQUEIRA [id number] 1946/05/06  Procedure: Intubation Indications: Airway protection and maintenance  Procedure Details Consent: Unable to obtain consent because of altered level of consciousness. Time Out: Verified patient identification, verified procedure, site/side was marked, verified correct patient position, special equipment/implants available, medications/allergies/relevent history reviewed, required imaging and test results available.  Performed  Maximum sterile technique was used including gloves, hand hygiene and mask.  MAC 4 Sedated with etomidate and 10mg fent Grade 1 view 7.5ETT placed  Good color change on CO2 detector   Evaluation Hemodynamic Status: BP stable throughout; O2 sats: stable throughout Patient's Current Condition: stable Complications: No apparent complications Patient did tolerate procedure well. Chest X-ray ordered to verify placement.  CXR: tube position acceptable.   BOctavio GravesIcard 02/29/2020

## 2020-02-29 NOTE — Progress Notes (Signed)
This chaplain responded to Pt. Code Blue with medical team.  The chaplain understands Ron phoned Pt. wife with a Pt. update and news of Pt. transfer to [facility]. The chaplain is available for staff and Pt. F/U spiritual care as needed.

## 2020-02-29 NOTE — Progress Notes (Addendum)
[facility] Progress Note  Devin Holt 74 y.o. 07-28-1946  CC:  Anemia, melena  Subjective: Patient is intubated and sedated.  He opens his eyes to voice.  Unable to report ROS.   ROS : Limited by patient status (intubated, sedated)  Objective: Vital signs in last 24 hours: Vitals:   02/29/20 1445 02/29/20 1500  BP: (!) 105/49 (!) 106/57  Pulse: 69 69  Resp: 20 20  Temp:    SpO2: 100% 100%    Physical Exam:  General:  Intubated and sedated but opens eyes to voice alone, no acute distress; NG tube in place, bilious output  Head:  Normocephalic, without obvious abnormality, atraumatic  Eyes:  Conjunctival pallor, EOMs intact  Lungs:   Clear to auscultation bilaterally  Heart:  Irregular rhythm (A fib) but regular rate  Abdomen:   Soft, non-tender (no grimace upon palpation), nondistended, normoactive bowel sounds, no guarding or peritoneal signs  Extremities: Extremities normal, atraumatic, no  Edema; SCDs in place  Pulses: 2+ and symmetric    Lab Results: Recent Labs    02/29/20 0543 02/29/20 0543 02/29/20 0856 02/29/20 0924  NA 135   < > 134* 134*  K 4.9   < > 4.8 5.3*  CL 100  --   --  101  CO2 25  --   --  22  GLUCOSE 125*  --   --  161*  BUN 18  --   --  21  CREATININE 1.79*  --   --  1.86*  CALCIUM 8.9  --   --  8.5*  MG  --   --   --  1.9   < > = values in this interval not displayed.   No results for input(s): AST, ALT, ALKPHOS, BILITOT, PROT, ALBUMIN in the last 72 hours. Recent Labs    02/27/20 0848 02/28/20 0510 02/29/20 0543 02/29/20 0543 02/29/20 0856 02/29/20 0924  WBC 4.4   < > 7.7  --   --  7.3  NEUTROABS 2.6  --  5.9  --   --   --   HGB 7.8*   < > 8.0*   < > 9.2* 8.7*  HCT 26.5*   < > 27.8*   < > 27.0* 30.1*  MCV 93.3   < > 93.3  --   --  94.4  PLT 217   < > 250  --   --  211   < > = values in this interval not displayed.   Recent Labs    02/29/20 0924  LABPROT 13.9  INR 1.1    Melena and anemia in the setting of  Plavix and aspirin use -Small bowel enteroscopy 6/12 revealed one non-bleeding AVM in the stomach and 5 non-bleeding AVMs in the duodenum treated with APC. -Colonoscopy 6/13 revealed 3 non-bleeding colonic AVMs, treated with APC, as well as one 6 mm polyp in the transverse colon path pending) -Hgb 8.0 today, stable, no further signs of GI bleeding  Hypercarbia with altered level of consciousness: currently intubated and on full vent support  A. Fib: on Eliquis, last dose 6/9  COPD  Correction to previous note, upon further records review and physical exam: patient does not have a pacemaker  Plan: Await capsule endoscopy results (download pending, should be available by tomorrow)  Recommend holding Eliquis until 6/20 if clinical status allows, as patient was noted to have multiple AVMs (stomach, duodenum, colon) that were APC'd.    Continue Protonix 40mg  BID.  Continue to monitor H&H with transfusion as needed to maintain hemoglobin greater than 7.  [facility] will follow.  Salley Slaughter PA-C 02/29/2020, 3:20 PM  Contact #  [phone]

## 2020-03-01 ENCOUNTER — Inpatient Hospital Stay (HOSPITAL_COMMUNITY): Payer: Medicare HMO

## 2020-03-01 DIAGNOSIS — J441 Chronic obstructive pulmonary disease with (acute) exacerbation: Secondary | ICD-10-CM

## 2020-03-01 LAB — CBC WITH DIFFERENTIAL/PLATELET
Abs Immature Granulocytes: 0.02 10*3/uL (ref 0.00–0.07)
Basophils Absolute: 0 10*3/uL (ref 0.0–0.1)
Basophils Relative: 0 %
Eosinophils Absolute: 0.3 10*3/uL (ref 0.0–0.5)
Eosinophils Relative: 5 %
HCT: 24 % — ABNORMAL LOW (ref 39.0–52.0)
Hemoglobin: 7.2 g/dL — ABNORMAL LOW (ref 13.0–17.0)
Immature Granulocytes: 0 %
Lymphocytes Relative: 13 %
Lymphs Abs: 0.6 10*3/uL — ABNORMAL LOW (ref 0.7–4.0)
MCH: 27.3 pg (ref 26.0–34.0)
MCHC: 30 g/dL (ref 30.0–36.0)
MCV: 90.9 fL (ref 80.0–100.0)
Monocytes Absolute: 0.5 10*3/uL (ref 0.1–1.0)
Monocytes Relative: 11 %
Neutro Abs: 3.4 10*3/uL (ref 1.7–7.7)
Neutrophils Relative %: 71 %
Platelets: 172 10*3/uL (ref 150–400)
RBC: 2.64 MIL/uL — ABNORMAL LOW (ref 4.22–5.81)
RDW: 15.8 % — ABNORMAL HIGH (ref 11.5–15.5)
WBC: 4.9 10*3/uL (ref 4.0–10.5)
nRBC: 0 % (ref 0.0–0.2)

## 2020-03-01 LAB — BASIC METABOLIC PANEL
Anion gap: 11 (ref 5–15)
BUN: 23 mg/dL (ref 8–23)
CO2: 23 mmol/L (ref 22–32)
Calcium: 8.5 mg/dL — ABNORMAL LOW (ref 8.9–10.3)
Chloride: 101 mmol/L (ref 98–111)
Creatinine, Ser: 1.64 mg/dL — ABNORMAL HIGH (ref 0.61–1.24)
GFR calc Af Amer: 47 mL/min — ABNORMAL LOW (ref >60)
GFR calc non Af Amer: 41 mL/min — ABNORMAL LOW (ref >60)
Glucose, Bld: 98 mg/dL (ref 70–99)
Potassium: 4.5 mmol/L (ref 3.5–5.1)
Sodium: 135 mmol/L (ref 135–145)

## 2020-03-01 LAB — PHOSPHORUS: Phosphorus: 3.8 mg/dL (ref 2.5–4.6)

## 2020-03-01 LAB — GLUCOSE, CAPILLARY
Glucose-Capillary: 93 mg/dL (ref 70–99)
Glucose-Capillary: 96 mg/dL (ref 70–99)
Glucose-Capillary: 101 mg/dL — ABNORMAL HIGH (ref 70–99)
Glucose-Capillary: 105 mg/dL — ABNORMAL HIGH (ref 70–99)
Glucose-Capillary: 127 mg/dL — ABNORMAL HIGH (ref 70–99)
Glucose-Capillary: 117 mg/dL — ABNORMAL HIGH (ref 70–99)

## 2020-03-01 LAB — TRIGLYCERIDES: Triglycerides: 172 mg/dL — ABNORMAL HIGH (ref <150)

## 2020-03-01 LAB — MAGNESIUM: Magnesium: 1.8 mg/dL (ref 1.7–2.4)

## 2020-03-01 MED ORDER — DOCUSATE SODIUM 50 MG/5ML PO LIQD
100.0000 mg | Freq: Two times a day (BID) | ORAL | Status: DC
  Administered 2020-03-01 – 2020-03-02 (×2): 100 mg via ORAL
  Filled 2020-03-01 (×2): qty 10

## 2020-03-01 MED ORDER — ASPIRIN 81 MG PO CHEW
81.0000 mg | CHEWABLE_TABLET | Freq: Every day | ORAL | Status: DC
  Administered 2020-03-01 – 2020-03-05 (×5): 81 mg via ORAL
  Filled 2020-03-01 (×5): qty 1

## 2020-03-01 MED ORDER — UMECLIDINIUM BROMIDE 62.5 MCG/INH IN AEPB
1.0000 | INHALATION_SPRAY | Freq: Every day | RESPIRATORY_TRACT | Status: DC
  Administered 2020-03-02 – 2020-03-04 (×3): 1 via RESPIRATORY_TRACT
  Filled 2020-03-01: qty 7

## 2020-03-01 MED ORDER — ACETAMINOPHEN 325 MG PO TABS: 650.0000 mg | ORAL_TABLET | Freq: Four times a day (QID) | ORAL | Status: DC | PRN

## 2020-03-01 MED ORDER — TAMSULOSIN HCL 0.4 MG PO CAPS: 0.4000 mg | ORAL_CAPSULE | Freq: Every day | ORAL | Status: DC

## 2020-03-01 MED ORDER — TAMSULOSIN HCL 0.4 MG PO CAPS
0.4000 mg | ORAL_CAPSULE | Freq: Every day | ORAL | Status: DC
  Administered 2020-03-01 – 2020-03-05 (×5): 0.4 mg via ORAL
  Filled 2020-03-01 (×5): qty 1

## 2020-03-01 MED ORDER — METOPROLOL TARTRATE 12.5 MG HALF TABLET: 50.0000 mg | ORAL_TABLET | Freq: Two times a day (BID) | ORAL | Status: DC

## 2020-03-01 MED ORDER — ACETAMINOPHEN 650 MG RE SUPP: 650.0000 mg | Freq: Four times a day (QID) | RECTAL | Status: DC | PRN

## 2020-03-01 MED ORDER — DILTIAZEM HCL 30 MG PO TABS: 60.0000 mg | ORAL_TABLET | Freq: Four times a day (QID) | ORAL | Status: DC

## 2020-03-01 MED ORDER — DOCUSATE SODIUM 50 MG/5ML PO LIQD: 100.0000 mg | Freq: Two times a day (BID) | ORAL | Status: DC

## 2020-03-01 MED ORDER — DILTIAZEM HCL ER COATED BEADS 240 MG PO CP24
240.0000 mg | ORAL_CAPSULE | Freq: Every day | ORAL | Status: DC
  Administered 2020-03-01 – 2020-03-05 (×5): 240 mg via ORAL
  Filled 2020-03-01 (×5): qty 1

## 2020-03-01 MED ORDER — PRAVASTATIN SODIUM 40 MG PO TABS
40.0000 mg | ORAL_TABLET | Freq: Every day | ORAL | Status: DC
  Administered 2020-03-01 – 2020-03-05 (×5): 40 mg via ORAL
  Filled 2020-03-01 (×5): qty 1

## 2020-03-01 MED ORDER — FENOFIBRATE 54 MG PO TABS
54.0000 mg | ORAL_TABLET | Freq: Every day | ORAL | Status: DC
  Administered 2020-03-01 – 2020-03-05 (×5): 54 mg via ORAL
  Filled 2020-03-01 (×5): qty 1

## 2020-03-01 MED ORDER — LEVALBUTEROL HCL 1.25 MG/3ML IN NEBU: 1.2500 mg | INHALATION_SOLUTION | RESPIRATORY_TRACT | Status: DC

## 2020-03-01 MED ORDER — ONDANSETRON HCL 4 MG PO TABS: 4.0000 mg | ORAL_TABLET | Freq: Four times a day (QID) | ORAL | Status: DC | PRN

## 2020-03-01 MED ORDER — FLUTICASONE FUROATE-VILANTEROL 100-25 MCG/INH IN AEPB
1.0000 | INHALATION_SPRAY | Freq: Every day | RESPIRATORY_TRACT | Status: DC
  Administered 2020-03-02 – 2020-03-04 (×3): 1 via RESPIRATORY_TRACT
  Filled 2020-03-01: qty 28

## 2020-03-01 MED ORDER — ONDANSETRON HCL 4 MG/2ML IJ SOLN: 4.0000 mg | Freq: Four times a day (QID) | INTRAMUSCULAR | Status: DC | PRN

## 2020-03-01 MED ORDER — FLUTICASONE-UMECLIDIN-VILANT 100-62.5-25 MCG/INH IN AEPB: 1.0000 | INHALATION_SPRAY | Freq: Every day | RESPIRATORY_TRACT | Status: DC

## 2020-03-01 MED ORDER — PANTOPRAZOLE SODIUM 40 MG PO TBEC
40.0000 mg | DELAYED_RELEASE_TABLET | Freq: Every day | ORAL | Status: DC
  Administered 2020-03-02 – 2020-03-05 (×4): 40 mg via ORAL
  Filled 2020-03-01 (×4): qty 1

## 2020-03-01 MED ORDER — FAMOTIDINE 20 MG PO TABS
20.0000 mg | ORAL_TABLET | Freq: Two times a day (BID) | ORAL | Status: DC
  Administered 2020-03-01 – 2020-03-02 (×2): 20 mg via ORAL
  Filled 2020-03-01 (×2): qty 1

## 2020-03-01 MED ORDER — NITROGLYCERIN 0.4 MG SL SUBL: 0.4000 mg | SUBLINGUAL_TABLET | SUBLINGUAL | Status: DC | PRN

## 2020-03-01 MED ORDER — POLYETHYLENE GLYCOL 3350 17 G PO PACK: 17.0000 g | PACK | Freq: Every day | ORAL | Status: DC

## 2020-03-01 MED ORDER — DILTIAZEM HCL 30 MG PO TABS
60.0000 mg | ORAL_TABLET | Freq: Four times a day (QID) | ORAL | Status: DC
  Administered 2020-03-01: 60 mg
  Filled 2020-03-01: qty 2

## 2020-03-01 MED ORDER — AMIODARONE HCL 200 MG PO TABS: 200.0000 mg | ORAL_TABLET | Freq: Two times a day (BID) | ORAL | Status: DC

## 2020-03-01 MED ORDER — AMIODARONE HCL 200 MG PO TABS
200.0000 mg | ORAL_TABLET | Freq: Two times a day (BID) | ORAL | Status: DC
  Administered 2020-03-01: 200 mg via ORAL
  Filled 2020-03-01 (×2): qty 1

## 2020-03-01 MED ORDER — METOPROLOL TARTRATE 50 MG PO TABS
50.0000 mg | ORAL_TABLET | Freq: Two times a day (BID) | ORAL | Status: DC
  Administered 2020-03-01 – 2020-03-05 (×8): 50 mg via ORAL
  Filled 2020-03-01: qty 1
  Filled 2020-03-01: qty 4
  Filled 2020-03-01 (×3): qty 1
  Filled 2020-03-01: qty 4
  Filled 2020-03-01 (×2): qty 1

## 2020-03-01 NOTE — Progress Notes (Signed)
No urine output in 12 hrs, bladder scan showed 449cc. In/out cathed for 460cc amber urine. Procedure explained to patient, nodded understanding and necessity.Sterile field maintained, patient tolerated well.

## 2020-03-01 NOTE — Progress Notes (Signed)
No urine output since 0200 bladder scan and in/out cath. Bladder scan repeated-45ml. Will continue to monitor for urge to urinate and offer urinal frequently, as well as bladder scanning.

## 2020-03-01 NOTE — Progress Notes (Signed)
[facility] Progress Note  Devin Holt 74 y.o. 20-Jan-1946  CC:  Anemia, melena  Subjective: Patient is intubated but awake and appears oriented.  Responds to questions with nods for yes/no.  Making hand gestures suggesting he wants his breathing tube removed.  Denies abdominal pain.  Per flowsheet, no bowel movement today.  RN Gaspar Skeeters state she has not seen any melena or rectal bleeding.  ROS : Limited by patient status (unable to speak due to intubation)  Objective: Vital signs in last 24 hours: Vitals:   03/01/20 1100 03/01/20 1122  BP: (!) 85/75   Pulse: 75   Resp: 16   Temp:  98.6 F (37 C)  SpO2: 100%     Physical Exam:  General:  Intubated but awake and appears oriented, no acute distress  Head:  Normocephalic, without obvious abnormality, atraumatic  Eyes:  Conjunctival pallor, EOMs intact  Lungs:   Coarse lung sounds bilaterally  Heart:  Irregular rhythm (A fib) but regular rate  Abdomen:   Soft, non-tender, nondistended, normoactive bowel sounds, no guarding or peritoneal signs  Extremities: Extremities normal, atraumatic, no edema  Pulses: 2+ and symmetric    Lab Results: Recent Labs    02/29/20 0924 02/29/20 1957 02/29/20 2005 03/01/20 0344  NA 134*   < > 136 135  K 5.3*   < > 4.5 4.5  CL 101  --   --  101  CO2 22  --   --  23  GLUCOSE 161*  --   --  98  BUN 21  --   --  23  CREATININE 1.86*  --   --  1.64*  CALCIUM 8.5*  --   --  8.5*  MG 1.9  --   --  1.8  PHOS  --   --   --  3.8   < > = values in this interval not displayed.   No results for input(s): AST, ALT, ALKPHOS, BILITOT, PROT, ALBUMIN in the last 72 hours. Recent Labs    02/29/20 0543 02/29/20 0856 02/29/20 0924 02/29/20 1957 02/29/20 2005 03/01/20 0344  WBC 7.7   < > 7.3  --   --  4.9  NEUTROABS 5.9  --   --   --   --  3.4  HGB 8.0*   < > 8.7*   < > 7.8* 7.2*  HCT 27.8*   < > 30.1*   < > 23.0* 24.0*  MCV 93.3   < > 94.4  --   --  90.9  PLT 250   < > 211  --   --   172   < > = values in this interval not displayed.   Recent Labs    02/29/20 0924  LABPROT 13.9  INR 1.1    Melena and anemia in the setting of Plavix and aspirin use -Small bowel enteroscopy 6/12 revealed one non-bleeding AVM in the stomach and 5 non-bleeding AVMs in the duodenum treated with APC. -Colonoscopy 6/13 revealed 3 non-bleeding colonic AVMs, treated with APC, as well as one 6 mm polyp in the transverse colon- bx: tubular adenoma, negative for high-grade dysplasia or malignancy.  Per Dr. Cristina Gong, no repeat screening colonoscopy recommended due to age. Results relayed to patient) -Hgb 7.2, mildly decreased from 7.8 yesterday and 8.5 the day before but no further signs of GI bleeding  Hypercarbia with altered level of consciousness: currently intubated and on full vent support; to be extubated this afternoon  A. Fib: on  Eliquis, last dose 6/9  Plan: Await capsule endoscopy results (technical difficulties have prevented download at this point; IT troubleshooting the issue)  Recommend holding Eliquis until 6/20 if clinical status allows, as patient was noted to have multiple AVMs (stomach, duodenum, colon) that were APC'd.    Continue Protonix 40mg  BID.  Continue to monitor H&H with transfusion as needed to maintain hemoglobin greater than 7.  [facility] will follow.  Salley Slaughter PA-C 03/01/2020, 12:48 PM  Contact #  [phone]

## 2020-03-01 NOTE — Procedures (Signed)
Extubation Procedure Note  Patient Details:   Name: REN ASPINALL DOB: 10/18/1945 MRN: [id number]   Airway Documentation:    Vent end date: 03/01/20 Vent end time: 1325   Evaluation  O2 sats: stable throughout Complications: No apparent complications Patient did tolerate procedure well. Bilateral Breath Sounds: Diminished, Clear   Yes   Leak test positive. No stridor noted   Jasmeet Manton 03/01/2020, 1:29 PM

## 2020-03-01 NOTE — Progress Notes (Addendum)
MD notified:  I&O @ 0200 w/ 443mL out; no output since & bladder scan now showing 528.  Hx of prostate issues on Flomax.  BTW is now successfully extubated on . MD Response:  Place foley. Bedside swallow in 2h and we can then restart flomax  Passed bedside swallow Resumed oral home meds; full liquid diet; up to [facility] for [place] visited & stated will return Thurs eve.  Has step dtr.  Also has dtr Clarise Cruz & son JD.   Sleep study results from Dr. Maxie Barb ofc in [place] received via fax and placed in paper chart.

## 2020-03-01 NOTE — Progress Notes (Signed)
Pt set up on CPAP for night time rest.  Tolerating well on auto titrate.  4LPM oxygen bled in.

## 2020-03-01 NOTE — Progress Notes (Signed)
NAME:  Devin Holt, MRN:  [id number], DOB:  [birth year], LOS: 6 ADMISSION DATE:  02/24/2020, CONSULTATION DATE:  03/01/2020 REFERRING MD:  [facility],  CHIEF COMPLAINT: Respiratory Arrest  Brief History   75 year old known COPD, atrial fibrillation GI bleed found to have agonal respirations intubated transfer to intensive care unit on 02/29/2020  History of present illness   74 year old male with known COPD, atrial fibrillation on anticoagulation, coronary artery disease, diabetes and has a history of duodenal AVMs and colonic AVMs and reported to [facility] 02/27/2019 several days of feeling weak without any evidence of GI bleed at that time.  Lab work-up revealed a hemoglobin of 5.5 at which time he was transferred to [facility].  02/27/2020 he underwent colonoscopy per Dr. Cristina Gong in [place] coagulation of AVMs was performed.  Is been scheduled for a capsule but on 02/29/2020 he is found to have agonal breathing but did not lose pulse CODE BLUE was called.  Pulmonary critical care to the bedside intubated him transferred to the intensive care unit.  pH was noted to be 7.08 PCO2 noted to be 95 despite being intubated mechanically ventilatory supported for over 20 minutes.  Chest x-ray revealed changes of chronic focal pulmonary disease with proper placement of endotracheal tube.  He has been transferred to the intensive care unit and to the pulmonary critical care service.  CT of the head was not needed as he is able to follow commands and move all extremities.  Most likely his altered mental status was secondary to elevated carbon dioxide levels.  Stat CBC has been sent to evaluate for possible continued GI bleed.  He will be monitored in intensive care unit for further evaluation and treatment  Past Medical History  GI bleed Atrial fibrillation Coronary artery disease Chronic obstructive pulmonary disease Diabetes  Significant Hospital Events   02/29/20 CODE Blue & intubation    Consults:  GI service Care plan  Procedures:  02/27/2020 colonoscopy 02/29/2020 intubation bedside during CODE BLUE  Significant Diagnostic Tests:    Micro Data:    Antimicrobials:    Interim history/subjective:  Pt has been retaining urine, requiring I/O cath because not taking home tamsulosin Pt minimally sedated w/ propofol able to  Follow commands and answer yes/no questions Vent settings have been weaned to PRVC 40% FIO2, PEEP 5  Pt's wife and daughter are at the bedside. They state this has happened to the patient several times over the last few months when he has been hospitalized. He completed an outpatient sleep study with [facility]ology but was "not a candidate for CPAP". Patient wears 2L o2 at home 24/7.  Hb 7.2  This morning. No obvious signs of ongoing clinical bleeding.  Pt denies pain this morning.   Objective   Blood pressure 118/62, pulse 81, temperature 98.7 F (37.1 C), temperature source Oral, resp. rate 16, height 5\' 8"  (1.727 m), weight 78.5 kg, SpO2 100 %.    Vent Mode: PRVC FiO2 (%):  [40 %-99 %] 40 % Set Rate:  [16 bmp-20 bmp] 16 bmp Vt Set:  [540 mL] 540 mL PEEP:  [5 cmH20] 5 cmH20 Plateau Pressure:  [14 cmH20-18 cmH20] 14 cmH20   Intake/Output Summary (Last 24 hours) at 03/01/2020 1048 Last data filed at 03/01/2020 0600 Gross per 24 hour  Intake 298.74 ml  Output 860 ml  Net -561.26 ml   Filed Weights   02/28/20 0844 02/28/20 2002 02/29/20 0900  Weight: 76.1 kg 73.3 kg 78.5 kg  Examination: General: Elderly male who is intubated, resting comfortably in bed  HENT: No JVD or lymphadenopathy Lungs: Coarse rhonchi bilaterally diminished in the bases Cardiovascular: Heart sounds are irregular Abdomen: S, NT,ND Extremities: Warm and dry Neuro: Minimally sedated, able to answer yes/no questions, moves all 4 extremities spontaneously, able to follow commands in all extremities GU: deferred   Resolved Hospital Problem list      Assessment & Plan:  Vent dependent respiratory failure in the setting of hypercarbia in respiratory arrest without loss of pulse.  Noted to have severe COPD chest x-ray and history of tobacco abuse. After initial intubation on 6/15, patient was noted to have a PCO2 at 95% despite being on full mechanical ventilatory support. Given the patient's h/o COPD and tobacco use, he likely chronically retains CO2. Per the patient's wife, apneic events and increased O2 requirements have been an issue for this patient since 12/20. He has completed a sleep study with Dr. Alcide Clever at [facility]. We do not have access to the results of the sleep study through care everywhere. Per the patient's wife, the sleep study results deemed him "not a candidate for CPAP".  The patient wears 2L o2 at home 24/7. He may need additional oxygen support overnight to prevent apneic events. - Follow-up with [facility] pulmonology regarding sleep study -Patient with O2 sat >94% on PRVC FiO2 40%, PEEP 5. Patient minimally sedated and able to follow commands. Appears appropriate for weaning.  -Bronchodilators  Recent GI bleed with interventional colonoscopy per GI services on 02/27/2019 noted to have hemoglobin of 5.5 prior to transfer from [facility]. Serial CBC Monitor for any evidence of bleeding GI is following EGD and Colonoscopy completed this admission. EGD w/ multiple angiectasias that did not appear to be bleeding. Colonoscopy w/ removal of 1 polyp and 3, non-bleeding angiectasias were noted.  Hold anticoagulation at this time in the setting of atrial fibrillation  BPH, Urinary retention - over the past 24 hours, the patient has experienced urinary retention requiring in and out catheterization. He regularly takes tamsulosin at home. - Restart tamsulosin  History of atrial fibrillation, history of coronary artery disease. Holding anticoagulation at this time Monitor for any repercussions of atrial  fibrillation Continue p.o. amiodarone p.o. Cardizem and beta-blocker  Altered mental status noted to have a PCO2 of 95 despite being on full mechanical ventilatory support which is most likely the cause of his altered mental status. No CT of the head at this time as he is following commands Continue to monitor  History of diabetes Sliding scale insulin protocol  Best practice:  Diet: NPO Pain/Anxiety/Delirium protocol (if indicated): PRN fentanyl, on propofol: Goal -1 to 0 VAP protocol (if indicated): in place DVT prophylaxis: Compression stockings, avoid anticoagulation given GI bleed GI prophylaxis: PPI Glucose control: SSI Mobility: Bedrest Code Status: Full Family Communication: Wife and Daughter at the bedside Disposition: ICU  Labs   CBC: Recent Labs  Lab 02/26/20 0720 02/26/20 0720 02/27/20 0848 02/27/20 0848 02/28/20 0510 02/28/20 0510 02/29/20 0543 02/29/20 1062 02/29/20 0924 02/29/20 1957 02/29/20 2005  WBC 5.4  --  4.4  --  6.6  --  7.7  --  7.3  --   --   NEUTROABS  --   --  2.6  --   --   --  5.9  --   --   --   --   HGB 8.2*   < > 7.8*   < > 8.0*   < > 8.0* 9.2*  8.7* 8.5* 7.8*  HCT 27.4*   < > 26.5*   < > 27.5*   < > 27.8* 27.0* 30.1* 25.0* 23.0*  MCV 92.6  --  93.3  --  93.9  --  93.3  --  94.4  --   --   PLT 252  --  217  --  232  --  250  --  211  --   --    < > = values in this interval not displayed.    Basic Metabolic Panel: Recent Labs  Lab 02/24/20 2151 02/24/20 2151 02/25/20 0500 02/25/20 0500 02/29/20 0543 02/29/20 0543 02/29/20 0856 02/29/20 0924 02/29/20 1957 02/29/20 2005 03/01/20 0344  NA 138   < > 136   < > 135   < > 134* 134* 133* 136 135  K 4.2   < > 4.2   < > 4.9   < > 4.8 5.3* 4.4 4.5 4.5  CL 104  --  104  --  100  --   --  101  --   --  101  CO2 26  --  27  --  25  --   --  22  --   --  23  GLUCOSE 91  --  97  --  125*  --   --  161*  --   --  98  BUN 24*  --  21  --  18  --   --  21  --   --  23  CREATININE 1.20   --  1.20  --  1.79*  --   --  1.86*  --   --  1.64*  CALCIUM 8.7*  --  8.8*  --  8.9  --   --  8.5*  --   --  8.5*  MG  --   --   --   --   --   --   --  1.9  --   --  1.8  PHOS  --   --   --   --   --   --   --   --   --   --  3.8   < > = values in this interval not displayed.   GFR: Estimated Creatinine Clearance: 38.8 mL/min (A) (by C-G formula based on SCr of 1.64 mg/dL (H)). Recent Labs  Lab 02/27/20 0848 02/28/20 0510 02/29/20 0543 02/29/20 0924  WBC 4.4 6.6 7.7 7.3  LATICACIDVEN  --   --   --  1.5    Liver Function Tests: Recent Labs  Lab 02/24/20 2151  AST 14*  ALT 10  ALKPHOS 43  BILITOT 0.8  PROT 5.7*  ALBUMIN 3.3*   No results for input(s): LIPASE, AMYLASE in the last 168 hours. No results for input(s): AMMONIA in the last 168 hours.  ABG    Component Value Date/Time   PHART 7.336 (L) 02/29/2020 2005   PCO2ART 46.1 02/29/2020 2005   PO2ART 75 (L) 02/29/2020 2005   HCO3 24.7 02/29/2020 2005   TCO2 26 02/29/2020 2005   ACIDBASEDEF 1.0 02/29/2020 2005   O2SAT 94.0 02/29/2020 2005     Coagulation Profile: Recent Labs  Lab 02/29/20 0924  INR 1.1    Cardiac Enzymes: No results for input(s): CKTOTAL, CKMB, CKMBINDEX, TROPONINI in the last 168 hours.  HbA1C: Hgb A1c MFr Bld  Date/Time Value Ref Range Status  02/24/2020 09:51 PM 5.8 (H) 4.8 - 5.6 % Final  Comment:    (NOTE) Pre diabetes:          5.7%-6.4%  Diabetes:              >6.4%  Glycemic control for   <7.0% adults with diabetes     CBG: Recent Labs  Lab 02/29/20 1539 02/29/20 1936 02/29/20 2318 03/01/20 0341 03/01/20 0723  GLUCAP 81 99 100* 93 96    Review of Systems:   Na  Past Medical History  He,  has a past medical history of A-fib ([place]), Angiodysplasia of stomach and duodenum with bleeding, AVM (arteriovenous malformation) of colon, CAD (coronary artery disease), CHF (congestive heart failure) ([place]), CVA (cerebral vascular accident) ([place]), DM2 (diabetes mellitus,  type 2) ([place]), Emphysema of lung ([place]), GI bleed, Iron deficiency anemia, and Myocardial infarct ([place]).   Surgical History    Past Surgical History:  Procedure Laterality Date   ANGIOPLASTY     CARDIAC CATHETERIZATION     COLONOSCOPY W/ POLYPECTOMY     COLONOSCOPY WITH PROPOFOL N/A 02/27/2020   Procedure: COLONOSCOPY WITH PROPOFOL;  Surgeon: Ronald Lobo, MD;  Location: [place];  Service: Endoscopy;  Laterality: N/A;   CORONARY STENT PLACEMENT     3-4   ENTEROSCOPY N/A 02/26/2020   Procedure: ENTEROSCOPY;  Surgeon: Ronald Lobo, MD;  Location: [place];  Service: Endoscopy;  Laterality: N/A;   GIVENS CAPSULE STUDY N/A 02/28/2020   Procedure: GIVENS CAPSULE STUDY;  Surgeon: Ronald Lobo, MD;  Location: [place];  Service: Endoscopy;  Laterality: N/A;   HOT HEMOSTASIS N/A 02/26/2020   Procedure: HOT HEMOSTASIS (ARGON PLASMA COAGULATION/BICAP);  Surgeon: Ronald Lobo, MD;  Location: [facility] ENDOSCOPY;  Service: Endoscopy;  Laterality: N/A;   HOT HEMOSTASIS N/A 02/27/2020   Procedure: HOT HEMOSTASIS (ARGON PLASMA COAGULATION/BICAP);  Surgeon: Ronald Lobo, MD;  Location: [facility] ENDOSCOPY;  Service: Endoscopy;  Laterality: N/A;   OTHER SURGICAL HISTORY     2 aortic stents   PACEMAKER IMPLANT     POLYPECTOMY  02/27/2020   Procedure: POLYPECTOMY;  Surgeon: Ronald Lobo, MD;  Location[facility] ENDOSCOPY;  Service: Endoscopy;;     Social History   reports that he has quit smoking. He has never used smokeless tobacco. He reports that he does not drink alcohol and does not use drugs.   Family History   His family history includes Colon cancer in his mother and sister; Hypertension in his father; Stroke in his father.   Allergies Allergies  Allergen Reactions   Isosorbide Other (See Comments)    "Made me feel weak"     Home Medications  Prior to Admission medications   Medication Sig Start Date End Date Taking? Authorizing Provider  amiodarone (PACERONE) 200 MG  tablet Take 200 mg by mouth 2 (two) times daily.   Yes [provider]  apixaban (ELIQUIS) 5 MG TABS tablet Take 5 mg by mouth 2 (two) times daily.   Yes [provider]  aspirin 81 MG EC tablet Take 81 mg by mouth daily. 02/08/20 03/09/20 Yes [provider]  diltiazem (CARDIZEM CD) 240 MG 24 hr capsule Take 240 mg by mouth daily. 02/07/20  Yes [provider]  famotidine (PEPCID) 20 MG tablet Take 20 mg by mouth 2 (two) times daily. 02/08/20  Yes [provider]  fenofibrate 54 MG tablet Take 54 mg by mouth daily. 02/07/20  Yes [provider]  Fluticasone-Umeclidin-Vilant (TRELEGY ELLIPTA) 100-62.5-25 MCG/INH AEPB Inhale 1 puff into the lungs daily.   Yes [provider]  levalbuterol (XOPENEX) 1.25 MG/3ML nebulizer solution Take 1.25 mg by nebulization See admin instructions. Inhale contents of one vial (1.25 mg) via nebulization every morning, may also use every 8 hours as needed for shortness of breath/wheezing 02/01/20  Yes [provider]  metFORMIN (GLUCOPHAGE) 500 MG tablet Take 500 mg by mouth daily. 02/23/20  Yes [provider]  metoprolol tartrate (LOPRESSOR) 50 MG tablet Take 50 mg by mouth 2 (two) times daily. 01/17/20  Yes [provider]  nitroGLYCERIN (NITROSTAT) 0.3 MG SL tablet Place 0.3 mg under the tongue every 5 (five) minutes as needed for chest pain. Max of 3 tablets daily 01/12/20  Yes [provider]  omeprazole (PRILOSEC) 20 MG capsule Take 20 mg by mouth daily. 02/23/20  Yes [provider]  OXYGEN Inhale 2 L into the lungs as needed (shortness of breath).   Yes [provider]  pravastatin (PRAVACHOL) 40 MG tablet Take 40 mg by mouth daily.  02/23/20  Yes [provider]  tamsulosin (FLOMAX) 0.4 MG CAPS capsule Take 0.4 mg by mouth daily. 01/31/20  Yes [provider]     Critical care time:    Lorne Skeens, Medical Student

## 2020-03-02 ENCOUNTER — Inpatient Hospital Stay (HOSPITAL_COMMUNITY): Payer: Medicare HMO

## 2020-03-02 LAB — POCT I-STAT 7, (LYTES, BLD GAS, ICA,H+H)
Acid-Base Excess: 0 mmol/L (ref 0.0–2.0)
Bicarbonate: 25.7 mmol/L (ref 20.0–28.0)
Calcium, Ion: 1.19 mmol/L (ref 1.15–1.40)
HCT: 22 % — ABNORMAL LOW (ref 39.0–52.0)
Hemoglobin: 7.5 g/dL — ABNORMAL LOW (ref 13.0–17.0)
O2 Saturation: 60 %
Potassium: 4.6 mmol/L (ref 3.5–5.1)
Sodium: 136 mmol/L (ref 135–145)
TCO2: 27 mmol/L (ref 22–32)
pCO2 arterial: 45 mmHg (ref 32.0–48.0)
pH, Arterial: 7.365 (ref 7.350–7.450)
pO2, Arterial: 33 mmHg — CL (ref 83.0–108.0)

## 2020-03-02 LAB — GLUCOSE, CAPILLARY
Glucose-Capillary: 102 mg/dL — ABNORMAL HIGH (ref 70–99)
Glucose-Capillary: 116 mg/dL — ABNORMAL HIGH (ref 70–99)
Glucose-Capillary: 118 mg/dL — ABNORMAL HIGH (ref 70–99)
Glucose-Capillary: 133 mg/dL — ABNORMAL HIGH (ref 70–99)
Glucose-Capillary: 133 mg/dL — ABNORMAL HIGH (ref 70–99)
Glucose-Capillary: 133 mg/dL — ABNORMAL HIGH (ref 70–99)

## 2020-03-02 MED ORDER — GUAIFENESIN-DM 100-10 MG/5ML PO SYRP
5.0000 mL | ORAL_SOLUTION | ORAL | Status: DC | PRN
  Administered 2020-03-02 – 2020-03-04 (×4): 5 mL via ORAL
  Filled 2020-03-02 (×4): qty 5

## 2020-03-02 MED ORDER — FAMOTIDINE 20 MG PO TABS
20.0000 mg | ORAL_TABLET | Freq: Every day | ORAL | Status: DC
  Administered 2020-03-03 – 2020-03-05 (×3): 20 mg via ORAL
  Filled 2020-03-02 (×3): qty 1

## 2020-03-02 NOTE — Progress Notes (Signed)
NAME:  Devin Holt, MRN:  [id number], DOB:  03/25/1946, LOS: 7 ADMISSION DATE:  02/24/2020, CONSULTATION DATE:  03/01/2020 REFERRING MD:  [facility],  CHIEF COMPLAINT: Respiratory Arrest  Brief History   74 year old known COPD, atrial fibrillation GI bleed found to have agonal respirations intubated transfer to intensive care unit on 02/29/2020  History of present illness   74 year old male with known COPD, atrial fibrillation on anticoagulation, coronary artery disease, diabetes and has a history of duodenal AVMs and colonic AVMs and reported to [facility] 02/27/2019 several days of feeling weak without any evidence of GI bleed at that time.  Lab work-up revealed a hemoglobin of 5.5 at which time he was transferred to [facility].  02/27/2020 he underwent colonoscopy per Dr. Cristina Gong in [place] coagulation of AVMs was performed.  Is been scheduled for a capsule but on 02/29/2020 he is found to have agonal breathing but did not lose pulse CODE BLUE was called.  Pulmonary critical care to the bedside intubated him transferred to the intensive care unit.  pH was noted to be 7.08 PCO2 noted to be 95 despite being intubated mechanically ventilatory supported for over 20 minutes.  Chest x-ray revealed changes of chronic focal pulmonary disease with proper placement of endotracheal tube.  He has been transferred to the intensive care unit and to the pulmonary critical care service.  CT of the head was not needed as he is able to follow commands and move all extremities.  Most likely his altered mental status was secondary to elevated carbon dioxide levels.  Stat CBC has been sent to evaluate for possible continued GI bleed.  He will be monitored in intensive care unit for further evaluation and treatment  Past Medical History  GI bleed Atrial fibrillation Coronary artery disease Chronic obstructive pulmonary disease Diabetes  Significant Hospital Events   02/29/20 CODE Blue & intubation    Consults:  GI service Care plan  Procedures:  02/27/2020 colonoscopy 02/29/2020 intubation bedside during CODE BLUE >> 6/16 extubated  Significant Diagnostic Tests:    Micro Data:    Antimicrobials:    Interim history/subjective:  Foley was placed yesterday in setting of acute urinary retention. Tamsulosin restarted.   Pt was extubated, now O2 Sat >88% on 2L [place]. Patient used CPAP overnight with no further episodes of hypercarbia respiratory arrest.   Will recheck Hb this morning. No obvious signs of ongoing clinical bleeding.   Patient denies pain. He has been out of bed to the chair and walked around his room yesterday with some exertional dyspnea. Otherwise does not appear dyspneic at rest.   Objective   Blood pressure 100/84, pulse 75, temperature 98 F (36.7 C), temperature source Oral, resp. rate 20, height 5\' 8"  (1.727 m), weight 78.5 kg, SpO2 98 %.    Vent Mode: PRVC FiO2 (%):  [40 %] 40 % Set Rate:  [16 bmp] 16 bmp Vt Set:  [540 mL] 540 mL PEEP:  [5 cmH20] 5 cmH20 Plateau Pressure:  [14 cmH20] 14 cmH20   Intake/Output Summary (Last 24 hours) at 03/02/2020 0825 Last data filed at 03/02/2020 0600 Gross per 24 hour  Intake 432.55 ml  Output 1925 ml  Net -1492.45 ml   Filed Weights   02/28/20 0844 02/28/20 2002 02/29/20 0900  Weight: 76.1 kg 73.3 kg 78.5 kg    Examination: General: Elderly male sitting in chair wearing [place], appears comfortable HENT: No JVD or lymphadenopathy Lungs: Coarse rhonchi bilaterally diminished in the bases Cardiovascular: Heart sounds  are irregular Abdomen: S, NT,ND Extremities: Warm and dry, no LE edema Neuro: AOx4, appropriate, spontaneously moving all limbs GU: deferred   Resolved Hospital Problem list   Hypercarbia respiratory arrest without loss of pulse AMS  Assessment & Plan:  Vent dependent respiratory failure in the setting of hypercarbia in respiratory arrest without loss of pulse.  Noted to have severe COPD chest  x-ray and history of tobacco abuse. Pt now extubated. O2 >88% on 2L [place]. Pt used CPAP overnight without issue. Per  Oval Linsey pulm, sleep study was adequate and the patient's AHI was 1.7. Suspect the patient's recurrent nightly hypercarbia/respiratory arrests are more likely due to the severity of his underlying COPD rather than OSA. The patient may still benefit from additional CPAP support overnight.  - Continue bronchodilators - Con't 2L [place] - Con't nightly CPAP  Recent GI bleed with interventional colonoscopy per GI services on 02/27/2019 noted to have hemoglobin of 5.5 prior to transfer from [facility]. Serial CBC Monitor for any evidence of bleeding GI is following EGD and Colonoscopy completed this admission. EGD w/ multiple angiectasias that did not appear to be bleeding. Colonoscopy w/ removal of 1 polyp and 3, non-bleeding angiectasias were noted.  Hold anticoagulation at this time in the setting of atrial fibrillation until at least 6/20 per GI  BPH, Urinary retention - over the past 24 hours, the patient has experienced urinary retention requiring in and out catheterization. He regularly takes tamsulosin at home. - Tamsulosin restarted yesterday - Pt still retaining urine, consider foley removal later today  History of atrial fibrillation, history of coronary artery disease. Holding anticoagulation at this time Monitor for any repercussions of atrial fibrillation Continue p.o. amiodarone p.o. Cardizem and beta-blocker  History of diabetes Sliding scale insulin protocol  Best practice:  Diet: NPO Pain/Anxiety/Delirium protocol (if indicated): Delirium precautions VAP protocol (if indicated): in place DVT prophylaxis: Compression stockings, avoid anticoagulation given GI bleed GI prophylaxis: PPI Glucose control: SSI Mobility: OOB ad l ib Code Status: Full Family Communication:  Disposition: floor status  Labs   CBC: Recent Labs  Lab 02/27/20 0848  02/27/20 0848 02/28/20 0510 02/28/20 0510 02/29/20 0543 02/29/20 0856 02/29/20 0924 02/29/20 1858 02/29/20 1957 02/29/20 2005 03/01/20 0344  WBC 4.4  --  6.6  --  7.7  --  7.3  --   --   --  4.9  NEUTROABS 2.6  --   --   --  5.9  --   --   --   --   --  3.4  HGB 7.8*   < > 8.0*   < > 8.0*   < > 8.7* 7.5* 8.5* 7.8* 7.2*  HCT 26.5*   < > 27.5*   < > 27.8*   < > 30.1* 22.0* 25.0* 23.0* 24.0*  MCV 93.3  --  93.9  --  93.3  --  94.4  --   --   --  90.9  PLT 217  --  232  --  250  --  211  --   --   --  172   < > = values in this interval not displayed.    Basic Metabolic Panel: Recent Labs  Lab 02/24/20 2151 02/24/20 2151 02/25/20 0500 02/25/20 0500 02/29/20 0543 02/29/20 0856 02/29/20 0924 02/29/20 1858 02/29/20 1957 02/29/20 2005 03/01/20 0344  NA 138   < > 136   < > 135   < > 134* 136 133* 136 135  K 4.2   < >  4.2   < > 4.9   < > 5.3* 4.6 4.4 4.5 4.5  CL 104  --  104  --  100  --  101  --   --   --  101  CO2 26  --  27  --  25  --  22  --   --   --  23  GLUCOSE 91  --  97  --  125*  --  161*  --   --   --  98  BUN 24*  --  21  --  18  --  21  --   --   --  23  CREATININE 1.20  --  1.20  --  1.79*  --  1.86*  --   --   --  1.64*  CALCIUM 8.7*  --  8.8*  --  8.9  --  8.5*  --   --   --  8.5*  MG  --   --   --   --   --   --  1.9  --   --   --  1.8  PHOS  --   --   --   --   --   --   --   --   --   --  3.8   < > = values in this interval not displayed.   GFR: Estimated Creatinine Clearance: 38.8 mL/min (A) (by C-G formula based on SCr of 1.64 mg/dL (H)). Recent Labs  Lab 02/28/20 0510 02/29/20 0543 02/29/20 0924 03/01/20 0344  WBC 6.6 7.7 7.3 4.9  LATICACIDVEN  --   --  1.5  --     Liver Function Tests: Recent Labs  Lab 02/24/20 2151  AST 14*  ALT 10  ALKPHOS 43  BILITOT 0.8  PROT 5.7*  ALBUMIN 3.3*   No results for input(s): LIPASE, AMYLASE in the last 168 hours. No results for input(s): AMMONIA in the last 168 hours.  ABG    Component Value  Date/Time   PHART 7.336 (L) 02/29/2020 2005   PCO2ART 46.1 02/29/2020 2005   PO2ART 75 (L) 02/29/2020 2005   HCO3 24.7 02/29/2020 2005   TCO2 26 02/29/2020 2005   ACIDBASEDEF 1.0 02/29/2020 2005   O2SAT 94.0 02/29/2020 2005     Coagulation Profile: Recent Labs  Lab 02/29/20 0924  INR 1.1    Cardiac Enzymes: No results for input(s): CKTOTAL, CKMB, CKMBINDEX, TROPONINI in the last 168 hours.  HbA1C: Hgb A1c MFr Bld  Date/Time Value Ref Range Status  02/24/2020 09:51 PM 5.8 (H) 4.8 - 5.6 % Final    Comment:    (NOTE) Pre diabetes:          5.7%-6.4%  Diabetes:              >6.4%  Glycemic control for   <7.0% adults with diabetes     CBG: Recent Labs  Lab 03/01/20 1536 03/01/20 1930 03/01/20 2318 03/02/20 0328 03/02/20 0803  GLUCAP 105* 127* 117* 102* 133*    Review of Systems:   Na  Past Medical History  He,  has a past medical history of A-fib ([place]), Angiodysplasia of stomach and duodenum with bleeding, AVM (arteriovenous malformation) of colon, CAD (coronary artery disease), CHF (congestive heart failure) ([place]), CVA (cerebral vascular accident) ([place]), DM2 (diabetes mellitus, type 2) ([place]), Emphysema of lung ([place]), GI bleed, Iron deficiency anemia, and Myocardial infarct ([place]).   Surgical History    Past  Surgical History:  Procedure Laterality Date  . ANGIOPLASTY    . CARDIAC CATHETERIZATION    . COLONOSCOPY W/ POLYPECTOMY    . COLONOSCOPY WITH PROPOFOL N/A 02/27/2020   Procedure: COLONOSCOPY WITH PROPOFOL;  Surgeon: Ronald Lobo, MD;  Location: [place];  Service: Endoscopy;  Laterality: N/A;  . CORONARY STENT PLACEMENT     3-4  . ENTEROSCOPY N/A 02/26/2020   Procedure: ENTEROSCOPY;  Surgeon: Ronald Lobo, MD;  Location: [place];  Service: Endoscopy;  Laterality: N/A;  . GIVENS CAPSULE STUDY N/A 02/28/2020   Procedure: GIVENS CAPSULE STUDY;  Surgeon: Ronald Lobo, MD;  Location: [place];  Service: Endoscopy;  Laterality: N/A;  .  HOT HEMOSTASIS N/A 02/26/2020   Procedure: HOT HEMOSTASIS (ARGON PLASMA COAGULATION/BICAP);  Surgeon: Ronald Lobo, MD;  Location: [facility] ENDOSCOPY;  Service: Endoscopy;  Laterality: N/A;  . HOT HEMOSTASIS N/A 02/27/2020   Procedure: HOT HEMOSTASIS (ARGON PLASMA COAGULATION/BICAP);  Surgeon: Ronald Lobo, MD;  Location: [facility] ENDOSCOPY;  Service: Endoscopy;  Laterality: N/A;  . OTHER SURGICAL HISTORY     2 aortic stents  . PACEMAKER IMPLANT    . POLYPECTOMY  02/27/2020   Procedure: POLYPECTOMY;  Surgeon: Ronald Lobo, MD;  Location: [facility] ENDOSCOPY;  Service: Endoscopy;;     Social History   reports that he has quit smoking. He has never used smokeless tobacco. He reports that he does not drink alcohol and does not use drugs.   Family History   His family history includes Colon cancer in his mother and sister; Hypertension in his father; Stroke in his father.   Allergies Allergies  Allergen Reactions  . Isosorbide Other (See Comments)    "Made me feel weak"     Home Medications  Prior to Admission medications   Medication Sig Start Date End Date Taking? Authorizing Provider  amiodarone (PACERONE) 200 MG tablet Take 200 mg by mouth 2 (two) times daily.   Yes [provider]  apixaban (ELIQUIS) 5 MG TABS tablet Take 5 mg by mouth 2 (two) times daily.   Yes [provider]  aspirin 81 MG EC tablet Take 81 mg by mouth daily. 02/08/20 03/09/20 Yes [provider]  diltiazem (CARDIZEM CD) 240 MG 24 hr capsule Take 240 mg by mouth daily. 02/07/20  Yes [provider]  famotidine (PEPCID) 20 MG tablet Take 20 mg by mouth 2 (two) times daily. 02/08/20  Yes [provider]  fenofibrate 54 MG tablet Take 54 mg by mouth daily. 02/07/20  Yes [provider]  Fluticasone-Umeclidin-Vilant (TRELEGY ELLIPTA) 100-62.5-25 MCG/INH AEPB Inhale 1 puff into the lungs daily.   Yes [provider]  levalbuterol (XOPENEX) 1.25 MG/3ML nebulizer solution  Take 1.25 mg by nebulization See admin instructions. Inhale contents of one vial (1.25 mg) via nebulization every morning, may also use every 8 hours as needed for shortness of breath/wheezing 02/01/20  Yes [provider]  metFORMIN (GLUCOPHAGE) 500 MG tablet Take 500 mg by mouth daily. 02/23/20  Yes [provider]  metoprolol tartrate (LOPRESSOR) 50 MG tablet Take 50 mg by mouth 2 (two) times daily. 01/17/20  Yes [provider]  nitroGLYCERIN (NITROSTAT) 0.3 MG SL tablet Place 0.3 mg under the tongue every 5 (five) minutes as needed for chest pain. Max of 3 tablets daily 01/12/20  Yes [provider]  omeprazole (PRILOSEC) 20 MG capsule Take 20 mg by mouth daily. 02/23/20  Yes [provider]  OXYGEN Inhale 2 L into the lungs as needed (shortness of  breath).   Yes [provider]  pravastatin (PRAVACHOL) 40 MG tablet Take 40 mg by mouth daily.  02/23/20  Yes [provider]  tamsulosin (FLOMAX) 0.4 MG CAPS capsule Take 0.4 mg by mouth daily. 01/31/20  Yes [provider]     Critical care time:    Lorne Skeens, Medical Student

## 2020-03-02 NOTE — Plan of Care (Signed)
  Problem: Bowel/Gastric: Goal: Will show no signs and symptoms of gastrointestinal bleeding Outcome: Progressing   Problem: Clinical Measurements: Goal: Complications related to the disease process, condition or treatment will be avoided or minimized Outcome: Progressing   Problem: Health Behavior/Discharge Planning: Goal: Ability to manage health-related needs will improve Outcome: Progressing   Problem: Clinical Measurements: Goal: Respiratory complications will improve Outcome: Progressing

## 2020-03-02 NOTE — Progress Notes (Signed)
[facility] Progress Note  Devin Holt 74 y.o. [birth year]  CC:  Anemia, melena  Subjective: Patient was extubated yesterday and reports feeling well today.  He tolerated his meal (heart healthy/carb modified diet).  He denies any nausea, vomiting, abdominal pain.  He states he had a bowel movement this afternoon that was yellowish and soft with no signs of melena or hematochezia.  ROS : Review of Systems  Cardiovascular: Negative for chest pain and palpitations.  Gastrointestinal: Negative for abdominal pain, blood in stool, constipation, diarrhea, heartburn, melena, nausea and vomiting.    Objective: Vital signs in last 24 hours: Vitals:   03/02/20 1400 03/02/20 1519  BP: (!) 108/48 113/60  Pulse: 72 63  Resp: 18   Temp:  98.5 F (36.9 C)  SpO2: 96% 96%    Physical Exam:  General:  Alert, oriented, cooperative, no distress, elderly; [place] in place  Head:  Normocephalic, without obvious abnormality, atraumatic  Eyes:  Conjunctival pallor, EOM's intact  Lungs:   Clear to auscultation bilaterally, mild tachypnea  Heart:  Regular rate and rhythm, S1, S2 normal  Abdomen:   Soft, non-tender, nondistended, normoactive bowel sounds, no guarding or peritoneal signs  Extremities: Extremities normal, atraumatic, no  edema  Pulses: 2+ and symmetric    Lab Results: Recent Labs    02/29/20 0924 02/29/20 1858 02/29/20 2005 03/01/20 0344  NA 134*   < > 136 135  K 5.3*   < > 4.5 4.5  CL 101  --   --  101  CO2 22  --   --  23  GLUCOSE 161*  --   --  98  BUN 21  --   --  23  CREATININE 1.86*  --   --  1.64*  CALCIUM 8.5*  --   --  8.5*  MG 1.9  --   --  1.8  PHOS  --   --   --  3.8   < > = values in this interval not displayed.   No results for input(s): AST, ALT, ALKPHOS, BILITOT, PROT, ALBUMIN in the last 72 hours. Recent Labs    02/29/20 0543 02/29/20 0856 02/29/20 0924 02/29/20 1858 02/29/20 2005 03/01/20 0344  WBC 7.7   < > 7.3  --   --  4.9  NEUTROABS  5.9  --   --   --   --  3.4  HGB 8.0*   < > 8.7*   < > 7.8* 7.2*  HCT 27.8*   < > 30.1*   < > 23.0* 24.0*  MCV 93.3   < > 94.4  --   --  90.9  PLT 250   < > 211  --   --  172   < > = values in this interval not displayed.   Recent Labs    02/29/20 0924  LABPROT 13.9  INR 1.1    Assessment: Anemia: Gastric, duodenal, and colonic AVMs treated with APC -Hemoglobin 7.2 yesterday, CBC today pending.  No further signs of melena or GI bleeding. -Capsule endoscopy did not reveal active or recent bleeding.  Retained food in the small bowel cause difficulty with mucosal visualization.  At the end of the study (12 hours 39 minutes) the capsule was still visualized in the small intestine.  Because of this, an abdominal x-ray was ordered to rule out a retained PillCam.  No retained PillCam seen on abdominal imaging.  Incidental finding of calcified gallstone noted.  Plan: Recommend holding Eliquis  until 6/20 if clinical status allows then cautiously resume and observe for any signs of GI bleeding.    Continue to monitor H&H with transfusion as needed to maintain hemoglobin greater than 7.  Continue Protonix 40 mg twice daily for 2 months, then continue 40 mg Protonix once daily indefinitely.  [facility] will sign off.  Please contact us if we can be of any further assistance during this hospital stay.  Salley Slaughter PA-C 03/02/2020, 4:04 PM  Contact #  [phone]

## 2020-03-03 LAB — GLUCOSE, CAPILLARY
Glucose-Capillary: 107 mg/dL — ABNORMAL HIGH (ref 70–99)
Glucose-Capillary: 114 mg/dL — ABNORMAL HIGH (ref 70–99)
Glucose-Capillary: 126 mg/dL — ABNORMAL HIGH (ref 70–99)
Glucose-Capillary: 132 mg/dL — ABNORMAL HIGH (ref 70–99)
Glucose-Capillary: 143 mg/dL — ABNORMAL HIGH (ref 70–99)
Glucose-Capillary: 143 mg/dL — ABNORMAL HIGH (ref 70–99)

## 2020-03-03 NOTE — Plan of Care (Signed)
  Problem: Bowel/Gastric: Goal: Will show no signs and symptoms of gastrointestinal bleeding Outcome: Progressing   Problem: Clinical Measurements: Goal: Complications related to the disease process, condition or treatment will be avoided or minimized Outcome: Progressing   Problem: Health Behavior/Discharge Planning: Goal: Ability to manage health-related needs will improve Outcome: Progressing   Problem: Clinical Measurements: Goal: Respiratory complications will improve Outcome: Progressing   Problem: Nutrition: Goal: Adequate nutrition will be maintained Outcome: Progressing

## 2020-03-03 NOTE — Progress Notes (Signed)
PROGRESS NOTE  JAKHAI FANT POE:[id number] DOB: [birth year] DOA: 02/24/2020 PCP: Bonnita Nasuti, MD  Brief History   74 year old man with COPD, atrial fibrillation on anticoagulation, CAD, history of duodenal and colonic AVM, admitted with acute on chronic GI blood loss, hemoglobin 5.5.  He decompensated 2 days post colonoscopy with hypercapnic respiratory failure require mechanical ventilation.  He was extubated 6/16.  Consultants   PCCM  Gastroenterology  Procedures   Colonoscopy  Mechanical ventilation  Extubated 03/01/2020.  Antibiotics   Anti-infectives (From admission, onward)   None      Subjective  The patient is sitting up at bedside in a chair. No new complaints.  Objective   Vitals:  Vitals:   03/03/20 0806 03/03/20 1527  BP:  105/61  Pulse:  68  Resp:    Temp:  98.6 F (37 C)  SpO2: 96% 98%   Exam:  Constitutional:   The patient is awake, alert, and oriented x 3. No acute distress. Respiratory:   No increased work of breathing.  No wheezes, rales, or rhonchi  No tactile fremitus  Breath sounds are distant. Cardiovascular:   Regular rate and rhythm  No murmurs, ectopy, or gallups.  No lateral PMI. No thrills. Abdomen:   Abdomen is soft, non-tender, non-distended  No hernias, masses, or organomegaly  Normoactive bowel sounds.  Musculoskeletal:   No cyanosis, clubbing, or edema Skin:   No rashes, lesions, ulcers  palpation of skin: no induration or nodules Neurologic:   CN 2-12 intact  Sensation all 4 extremities intact Psychiatric:   Mental status o Mood, affect appropriate o Orientation to person, place, time   judgment and insight appear intact  I have personally reviewed the following:   Today's Data   Vitals,   Imaging   Abdominal film: small right pleural effusion with basilar airspace disease. No radiopaque foreign bodies. Calcified gallstone in the right upper quadrant.  Scheduled Meds:  aspirin   81 mg Oral Daily   Chlorhexidine Gluconate Cloth  6 each Topical Daily   diltiazem  240 mg Oral Daily   famotidine  20 mg Oral Daily   fenofibrate  54 mg Oral Daily   fluticasone furoate-vilanterol  1 puff Inhalation Daily   insulin aspart  0-9 Units Subcutaneous Q4H   levalbuterol  1.25 mg Nebulization BID   metoprolol tartrate  50 mg Oral BID   pantoprazole  40 mg Oral Daily   pravastatin  40 mg Oral Daily   tamsulosin  0.4 mg Oral Daily   umeclidinium bromide  1 puff Inhalation Daily   Continuous Infusions:  sodium chloride Stopped (03/01/20 1807)    Principal Problem:   Gastrointestinal hemorrhage with melena Active Problems:   PAF (paroxysmal atrial fibrillation) (HCC)   CAD (coronary artery disease)   COPD (chronic obstructive pulmonary disease) (HCC)   Chronic respiratory failure with hypoxia and hypercapnia (HCC)   DM2 (diabetes mellitus, type 2) (HCC)   Acute blood loss anemia   Angiodysplasia of stomach and duodenum with bleeding   AVM (arteriovenous malformation) of colon   LOS: 8 days   A & P   Vent dependent respiratory failure in the setting of hypercarbia in respiratory arrest without loss of pulse. Noted to have severe COPD chest x-ray and history of tobacco abuse:  Pt now extubated. SaO2 100% on 2L [place]. Pt used CPAP overnight without issue. Per  Oval Linsey pulm, sleep study was adequate and the patient's AHI was 1.7. Suspect the patient's recurrent nightly hypercarbia/respiratory arrests  are more likely due to the severity of his underlying COPD rather than OSA. The patient may still benefit from additional CPAP support overnight, although he is apparently refusing to wear it. I appreciate pulmonary critical care's assistance.  Recent GI bleed with interventional colonoscopy per GI services on 02/27/2019 noted to have hemoglobin of 5.5 prior to transfer from [facility]:  Recent Labs (last 2 labs)       Recent Labs    02/29/20 2005  03/01/20 0344  HGB 7.8* 7.2*     Continue to monitor hemoglobin. Transfuse for hemoglobin less than 7.0. Will  continue to hold anticoagulation at least till March 05, 2020 per GIs recommendation. The patient will be continued on pantoprazole 40 mg bid x 2 months, then continue on 40 mg daily indefinitely.  BPH/Urinary retention: Over the past 24 hours, the patient has experienced urinary retention requiring in and out catheterization. Flomax has been continued. Monitor urinary output and volume status.  History of atrial fibrillation, history of coronary artery disease: anticoagulation to be held until 03/05/2020 as per gastroenterology. Heart rate is well controlled with Cardizem CD 240 mg daily and metoprolol 50 mg bid. Monitor on telemetry.   History of diabetes CBG (last 3)  Recent Labs (last 2 labs)        Recent Labs    03/02/20 2335 03/03/20 0312 03/03/20 0749  GLUCAP 118* 114* 132*     Sliding-scale insulin protocol  DVT prophylaxis: Compression stockings, avoid anticoagulation given GI bleed Code Status: Full Family Communication: March 03, 2020 patient updated bedside Disposition: Pulmonary progressive care unit  Status is: Inpatient  Remains inpatient appropriate because:Inpatient level of care appropriate due to severity of illness. Barriers to discharge Increased FIO2 requirements.  Dispo:  Patient From: Home  Planned Disposition: Home  Expected discharge date: 02/29/20  Medically stable for discharge: No  Arynn Armand, DO [facility] Direct contact: see [email or web address]  7PM-7AM contact night coverage as above 03/03/2020, 4:35 PM  LOS: 8 days

## 2020-03-03 NOTE — Progress Notes (Signed)
NAME:  Devin Holt, MRN:  [id number], DOB:  Aug 17, 1946, LOS: 65 ADMISSION DATE:  02/24/2020, CONSULTATION DATE:  03/01/2020 REFERRING MD:  [facility],  CHIEF COMPLAINT: Respiratory Arrest  Brief History   74 year old known COPD, atrial fibrillation GI bleed found to have agonal respirations intubated transfer to intensive care unit on 02/29/2020  History of present illness   74 year old male with known COPD, atrial fibrillation on anticoagulation, coronary artery disease, diabetes and has a history of duodenal AVMs and colonic AVMs and reported to [facility] 02/27/2019 several days of feeling weak without any evidence of GI bleed at that time.  Lab work-up revealed a hemoglobin of 5.5 at which time he was transferred to [facility].  02/27/2020 he underwent colonoscopy per Dr. Cristina Gong in [place] coagulation of AVMs was performed.  Is been scheduled for a capsule but on 02/29/2020 he is found to have agonal breathing but did not lose pulse CODE BLUE was called.  Pulmonary critical care to the bedside intubated him transferred to the intensive care unit.  pH was noted to be 7.08 PCO2 noted to be 95 despite being intubated mechanically ventilatory supported for over 20 minutes.  Chest x-ray revealed changes of chronic focal pulmonary disease with proper placement of endotracheal tube.  He has been transferred to the intensive care unit and to the pulmonary critical care service.  CT of the head was not needed as he is able to follow commands and move all extremities.  Most likely his altered mental status was secondary to elevated carbon dioxide levels.  Stat CBC has been sent to evaluate for possible continued GI bleed.  He will be monitored in intensive care unit for further evaluation and treatment  Past Medical History  GI bleed Atrial fibrillation Coronary artery disease Chronic obstructive pulmonary disease Diabetes  Significant Hospital Events   02/29/20 CODE Blue & intubation    Consults:  GI service Care plan  Procedures:  02/27/2020 colonoscopy 02/29/2020 intubation bedside during CODE BLUE >> 6/16 extubated  Significant Diagnostic Tests:    Micro Data:    Antimicrobials:    Interim history/subjective:  Been transferred to floor no acute distress.   Objective   Blood pressure (!) 132/58, pulse 73, temperature 98.2 F (36.8 C), resp. rate 18, height 5\' 8"  (1.727 m), weight 78.5 kg, SpO2 96 %.        Intake/Output Summary (Last 24 hours) at 03/03/2020 0824 Last data filed at 03/02/2020 1400 Gross per 24 hour  Intake 360 ml  Output 400 ml  Net -40 ml   Filed Weights   02/28/20 0844 02/28/20 2002 02/29/20 0900  Weight: 76.1 kg 73.3 kg 78.5 kg    Examination: Hard of hearing 74 year old male in no acute distress.  On 2 L nasal cannula with O2 saturations of 95%.  Able to sit up walk eat reports this is normal. No JVD or lymphadenopathy is appreciated Decreased air movement throughout Heart sounds are irregular with controlled ventricular rate of 67 Abdomen soft and nontender positive bowel sounds Extremity warm without edema  Resolved Hospital Problem list   Hypercarbia respiratory arrest without loss of pulse AMS  Assessment & Plan:  Vent dependent respiratory failure in the setting of hypercarbia in respiratory arrest without loss of pulse.  Noted to have severe COPD chest x-ray and history of tobacco abuse. Pt now extubated. O2 >88% on 2L [place]. Pt used CPAP overnight without issue. Per  Oval Linsey pulm, sleep study was adequate and the patient's AHI  was 1.7. Suspect the patient's recurrent nightly hypercarbia/respiratory arrests are more likely due to the severity of his underlying COPD rather than OSA. The patient may still benefit from additional CPAP support overnight.   Not wearing CPAP Is returned to his normal 2 L nasal cannula without respiratory distress Pulmonary critical care will be available as needed  Recent GI bleed with  interventional colonoscopy per GI services on 02/27/2019 noted to have hemoglobin of 5.5 prior to transfer from [place]    02/29/20 2005 03/01/20 0344  HGB 7.8* 7.2*   Monitor CBC see Monitor for any evidence of bleeding GI has been following Continue to hold anticoagulation at least till March 05, 2020 per GIs request    BPH, Urinary retention - over the past 24 hours, the patient has experienced urinary retention requiring in and out catheterization. He regularly takes tamsulosin at home. Flomax Monitor urine output  History of atrial fibrillation, history of coronary artery disease. Holding anticoagulation Rate control with diltiazem Metroprolol remains in atrial fib with controlled rate of 67  History of diabetes CBG (last 3)  Recent Labs    03/02/20 2335 03/03/20 0312 03/03/20 0749  GLUCAP 118* 114* 132*    Sliding-scale insulin protocol  Best practice:  Diet: Heart healthy diet Pain/Anxiety/Delirium protocol (if indicated): Delirium precautions VAP protocol (if indicated): in place DVT prophylaxis: Compression stockings, avoid anticoagulation given GI bleed GI prophylaxis: PPI Glucose control: SSI Mobility: OOB ad l ib Code Status: Full Family Communication: March 03, 2020 patient updated bedside Disposition: Pulmonary progressive care unit  Labs   CBC: Recent Labs  Lab 02/27/20 0848 02/27/20 0848 02/28/20 0510 02/28/20 0510 02/29/20 0543 02/29/20 8315 02/29/20 0924 02/29/20 1858 02/29/20 1957 02/29/20 2005 03/01/20 0344  WBC 4.4  --  6.6  --  7.7  --  7.3  --   --   --  4.9  NEUTROABS 2.6  --   --   --  5.9  --   --   --   --   --  3.4  HGB 7.8*   < > 8.0*   < > 8.0*   < > 8.7* 7.5* 8.5* 7.8* 7.2*  HCT 26.5*   < > 27.5*   < > 27.8*   < > 30.1* 22.0* 25.0* 23.0* 24.0*  MCV 93.3  --  93.9  --  93.3  --  94.4  --   --   --  90.9  PLT 217  --  232  --  250  --  211  --   --   --  172   < > = values in this interval not  displayed.    Basic Metabolic Panel: Recent Labs  Lab 02/29/20 0543 02/29/20 0856 02/29/20 0924 02/29/20 1858 02/29/20 1957 02/29/20 2005 03/01/20 0344  NA 135   < > 134* 136 133* 136 135  K 4.9   < > 5.3* 4.6 4.4 4.5 4.5  CL 100  --  101  --   --   --  101  CO2 25  --  22  --   --   --  23  GLUCOSE 125*  --  161*  --   --   --  98  BUN 18  --  21  --   --   --  23  CREATININE 1.79*  --  1.86*  --   --   --  1.64*  CALCIUM 8.9  --  8.5*  --   --   --  8.5*  MG  --   --  1.9  --   --   --  1.8  PHOS  --   --   --   --   --   --  3.8   < > = values in this interval not displayed.   GFR: Estimated Creatinine Clearance: 38.8 mL/min (A) (by C-G formula based on SCr of 1.64 mg/dL (H)). Recent Labs  Lab 02/28/20 0510 02/29/20 0543 02/29/20 0924 03/01/20 0344  WBC 6.6 7.7 7.3 4.9  LATICACIDVEN  --   --  1.5  --     Liver Function Tests: No results for input(s): AST, ALT, ALKPHOS, BILITOT, PROT, ALBUMIN in the last 168 hours. No results for input(s): LIPASE, AMYLASE in the last 168 hours. No results for input(s): AMMONIA in the last 168 hours.  ABG    Component Value Date/Time   PHART 7.336 (L) 02/29/2020 2005   PCO2ART 46.1 02/29/2020 2005   PO2ART 75 (L) 02/29/2020 2005   HCO3 24.7 02/29/2020 2005   TCO2 26 02/29/2020 2005   ACIDBASEDEF 1.0 02/29/2020 2005   O2SAT 94.0 02/29/2020 2005     Coagulation Profile: Recent Labs  Lab 02/29/20 0924  INR 1.1    Cardiac Enzymes: No results for input(s): CKTOTAL, CKMB, CKMBINDEX, TROPONINI in the last 168 hours.  HbA1C: Hgb A1c MFr Bld  Date/Time Value Ref Range Status  02/24/2020 09:51 PM 5.8 (H) 4.8 - 5.6 % Final    Comment:    (NOTE) Pre diabetes:          5.7%-6.4%  Diabetes:              >6.4%  Glycemic control for   <7.0% adults with diabetes     CBG: Recent Labs  Lab 03/02/20 1521 03/02/20 2030 03/02/20 2335 03/03/20 0312 03/03/20 0749  GLUCAP 133* 133* 118* 114* 132*    Critical care  time: none   Richardson Landry Tyniah Kastens ACNP Acute Care Nurse Practitioner [place] Please consult Amion 03/03/2020, 8:24 AM

## 2020-03-04 LAB — CBC WITH DIFFERENTIAL/PLATELET
Abs Immature Granulocytes: 0.01 10*3/uL (ref 0.00–0.07)
Basophils Absolute: 0 10*3/uL (ref 0.0–0.1)
Basophils Relative: 1 %
Eosinophils Absolute: 0.5 10*3/uL (ref 0.0–0.5)
Eosinophils Relative: 12 %
HCT: 23.9 % — ABNORMAL LOW (ref 39.0–52.0)
Hemoglobin: 7 g/dL — ABNORMAL LOW (ref 13.0–17.0)
Immature Granulocytes: 0 %
Lymphocytes Relative: 16 %
Lymphs Abs: 0.6 10*3/uL — ABNORMAL LOW (ref 0.7–4.0)
MCH: 26.9 pg (ref 26.0–34.0)
MCHC: 29.3 g/dL — ABNORMAL LOW (ref 30.0–36.0)
MCV: 91.9 fL (ref 80.0–100.0)
Monocytes Absolute: 0.4 10*3/uL (ref 0.1–1.0)
Monocytes Relative: 11 %
Neutro Abs: 2.3 10*3/uL (ref 1.7–7.7)
Neutrophils Relative %: 60 %
Platelets: 169 10*3/uL (ref 150–400)
RBC: 2.6 MIL/uL — ABNORMAL LOW (ref 4.22–5.81)
RDW: 15.3 % (ref 11.5–15.5)
WBC: 3.9 10*3/uL — ABNORMAL LOW (ref 4.0–10.5)
nRBC: 0 % (ref 0.0–0.2)

## 2020-03-04 LAB — GLUCOSE, CAPILLARY
Glucose-Capillary: 100 mg/dL — ABNORMAL HIGH (ref 70–99)
Glucose-Capillary: 104 mg/dL — ABNORMAL HIGH (ref 70–99)
Glucose-Capillary: 108 mg/dL — ABNORMAL HIGH (ref 70–99)
Glucose-Capillary: 131 mg/dL — ABNORMAL HIGH (ref 70–99)
Glucose-Capillary: 144 mg/dL — ABNORMAL HIGH (ref 70–99)
Glucose-Capillary: 154 mg/dL — ABNORMAL HIGH (ref 70–99)

## 2020-03-04 LAB — BASIC METABOLIC PANEL
Anion gap: 6 (ref 5–15)
BUN: 21 mg/dL (ref 8–23)
CO2: 30 mmol/L (ref 22–32)
Calcium: 8.9 mg/dL (ref 8.9–10.3)
Chloride: 103 mmol/L (ref 98–111)
Creatinine, Ser: 1.44 mg/dL — ABNORMAL HIGH (ref 0.61–1.24)
GFR calc Af Amer: 55 mL/min — ABNORMAL LOW (ref >60)
GFR calc non Af Amer: 48 mL/min — ABNORMAL LOW (ref >60)
Glucose, Bld: 108 mg/dL — ABNORMAL HIGH (ref 70–99)
Potassium: 4.6 mmol/L (ref 3.5–5.1)
Sodium: 139 mmol/L (ref 135–145)

## 2020-03-04 LAB — HEMOGLOBIN AND HEMATOCRIT, BLOOD
HCT: 27.5 % — ABNORMAL LOW (ref 39.0–52.0)
Hemoglobin: 8.2 g/dL — ABNORMAL LOW (ref 13.0–17.0)

## 2020-03-04 LAB — PREPARE RBC (CROSSMATCH)

## 2020-03-04 MED ORDER — SODIUM CHLORIDE 0.9% IV SOLUTION: Freq: Once | INTRAVENOUS | Status: AC

## 2020-03-04 NOTE — Progress Notes (Signed)
Pt resting comfortably in chair.  No complaints of pain and stated slept well last night.  Ate 100% of breakfast and 700 mL fluids. Blood sugar 100, not covered with insulin.  Lung sounds clear and no shortness of breath reported. Currently on 1L [place] at 100%, will continue to wean to room air.   Pt hgb 7.0 this morning & orders received for 1 unit packed RBCs.  RN obtained consent and placed in chart. Waiting for phlebotomy to draw new type and screen and for blood to be ready.  Pt requesting foley to be discontinued, will speak with MD.

## 2020-03-04 NOTE — Progress Notes (Addendum)
PROGRESS NOTE  Devin Holt LFY:[id number] DOB: 03-20-1946 DOA: 02/24/2020 PCP: Bonnita Nasuti, MD  Brief History   74 year old man with COPD, atrial fibrillation on anticoagulation, CAD, history of duodenal and colonic AVM, admitted with acute on chronic GI blood loss, hemoglobin 5.5.  He decompensated 2 days post colonoscopy with hypercapnic respiratory failure require mechanical ventilation.  He was extubated 6/16.  The patient was admitted to a telemetry bed. This morning his hemoglobin had dropped to 7.0. He will be transfused with one unit of PRBC's.  Consultants   PCCM  Gastroenterology  Procedures   Colonoscopy  Mechanical ventilation  Extubated 03/01/2020.  Antibiotics   Anti-infectives (From admission, onward)   None     Subjective  The patient is sitting up at bedside in a chair. No new complaints.  Objective   Vitals:  Vitals:   03/04/20 0754 03/04/20 0833  BP: 103/89   Pulse: 71   Resp: 16   Temp:    SpO2: 94% 100%   Exam:  Constitutional:   The patient is awake, alert, and oriented x 3. No acute distress. Respiratory:   No increased work of breathing.  No wheezes, rales, or rhonchi  No tactile fremitus  Breath sounds are distant. Cardiovascular:   Regular rate and rhythm  No murmurs, ectopy, or gallups.  No lateral PMI. No thrills. Abdomen:   Abdomen is soft, non-tender, non-distended  No hernias, masses, or organomegaly  Normoactive bowel sounds.  Musculoskeletal:   No cyanosis, clubbing, or edema Skin:   No rashes, lesions, ulcers  palpation of skin: no induration or nodules Neurologic:   CN 2-12 intact  Sensation all 4 extremities intact Psychiatric:   Mental status o Mood, affect appropriate o Orientation to person, place, time   judgment and insight appear intact  I have personally reviewed the following:   Today's Data   Vitals,   Imaging   Abdominal film: small right pleural effusion with basilar  airspace disease. No radiopaque foreign bodies. Calcified gallstone in the right upper quadrant.  Scheduled Meds:  sodium chloride   Intravenous Once   aspirin  81 mg Oral Daily   Chlorhexidine Gluconate Cloth  6 each Topical Daily   diltiazem  240 mg Oral Daily   famotidine  20 mg Oral Daily   fenofibrate  54 mg Oral Daily   fluticasone furoate-vilanterol  1 puff Inhalation Daily   insulin aspart  0-9 Units Subcutaneous Q4H   metoprolol tartrate  50 mg Oral BID   pantoprazole  40 mg Oral Daily   pravastatin  40 mg Oral Daily   tamsulosin  0.4 mg Oral Daily   umeclidinium bromide  1 puff Inhalation Daily   Continuous Infusions:  sodium chloride Stopped (03/01/20 1807)    Principal Problem:   Gastrointestinal hemorrhage with melena Active Problems:   PAF (paroxysmal atrial fibrillation) (HCC)   CAD (coronary artery disease)   COPD (chronic obstructive pulmonary disease) (HCC)   Chronic respiratory failure with hypoxia and hypercapnia (HCC)   DM2 (diabetes mellitus, type 2) (HCC)   Acute blood loss anemia   Angiodysplasia of stomach and duodenum with bleeding   AVM (arteriovenous malformation) of colon   LOS: 9 days   A & P   Vent dependent respiratory failure in the setting of hypercarbia in respiratory arrest without loss of pulse. Noted to have severe COPD chest x-ray and history of tobacco abuse:  Pt now extubated. SaO2 100% on 2L [place]. Pt used CPAP overnight without  issue. Per  Oval Linsey pulm, sleep study was adequate and the patient's AHI was 1.7. Suspect the patient's recurrent nightly hypercarbia/respiratory arrests are more likely due to the severity of his underlying COPD rather than OSA. The patient may still benefit from additional CPAP support overnight, although he is apparently refusing to wear it. I appreciate pulmonary critical care's assistance.  Recent GI bleed with interventional colonoscopy per GI services on 02/27/2019 noted to have hemoglobin of  5.5 prior to transfer from [facility]:  Recent Labs (last 2 labs)       Recent Labs    02/29/20 2005 03/01/20 0344  HGB 7.8* 7.2*     Continue to monitor hemoglobin. Transfuse for hemoglobin less than 7.0. Hemoglobin dropped to 7.0 this am. He will receive one unit PRBC's in transfusion. Will  continue to hold anticoagulation at least till March 09, 2020 per GIs recommendation. The patient will be continued on pantoprazole 40 mg bid x 2 months, then continue on 40 mg daily indefinitely.  BPH/Urinary retention: Over the past 24 hours, the patient has experienced urinary retention requiring in and out catheterization. Flomax has been continued. Monitor urinary output and volume status.  History of atrial fibrillation, history of coronary artery disease: anticoagulation to be held until 03/05/2020 as per gastroenterology. Heart rate is well controlled with Cardizem CD 240 mg daily and metoprolol 50 mg bid. Monitor on telemetry.   History of diabetes CBG (last 3)  Recent Labs (last 2 labs)        Recent Labs    03/02/20 2335 03/03/20 0312 03/03/20 0749  GLUCAP 118* 114* 132*     Sliding-scale insulin protocol  DVT prophylaxis: Compression stockings, avoid anticoagulation given GI bleed Code Status: Full Family Communication: March 03, 2020 patient updated bedside Disposition: Pulmonary progressive care unit  Status is: Inpatient  Remains inpatient appropriate because:Inpatient level of care appropriate due to severity of illness. Barriers to discharge Increased FIO2 requirements.  Dispo:  Patient From: Home  Planned Disposition: Home  Expected discharge date: 03/07/2020  Medically stable for discharge: No  Shanieka Blea, DO [facility] Direct contact: see [email or web address]  7PM-7AM contact night coverage as above 03/04/2020, 12:46 PM  LOS: 8 days

## 2020-03-04 NOTE — Progress Notes (Signed)
Pt foley discontinued at 1830.

## 2020-03-04 NOTE — Progress Notes (Signed)
Pt resting in chair at shift change.  No complaints at this time.  Ate 100% of dinner.  Pt has not urinated since foley dc'd at 1830.  Passed along to night shift.

## 2020-03-04 NOTE — Plan of Care (Signed)
  Problem: Bowel/Gastric: Goal: Will show no signs and symptoms of gastrointestinal bleeding Outcome: Progressing   Problem: Clinical Measurements: Goal: Complications related to the disease process, condition or treatment will be avoided or minimized Outcome: Progressing   Problem: Health Behavior/Discharge Planning: Goal: Ability to manage health-related needs will improve Outcome: Progressing

## 2020-03-05 LAB — TYPE AND SCREEN
ABO/RH(D): O POS
Antibody Screen: NEGATIVE
Unit division: 0

## 2020-03-05 LAB — CBC WITH DIFFERENTIAL/PLATELET
Abs Immature Granulocytes: 0.02 10*3/uL (ref 0.00–0.07)
Basophils Absolute: 0.1 10*3/uL (ref 0.0–0.1)
Basophils Relative: 1 %
Eosinophils Absolute: 0.5 10*3/uL (ref 0.0–0.5)
Eosinophils Relative: 10 %
HCT: 30.8 % — ABNORMAL LOW (ref 39.0–52.0)
Hemoglobin: 9 g/dL — ABNORMAL LOW (ref 13.0–17.0)
Immature Granulocytes: 0 %
Lymphocytes Relative: 14 %
Lymphs Abs: 0.7 10*3/uL (ref 0.7–4.0)
MCH: 26.8 pg (ref 26.0–34.0)
MCHC: 29.2 g/dL — ABNORMAL LOW (ref 30.0–36.0)
MCV: 91.7 fL (ref 80.0–100.0)
Monocytes Absolute: 0.6 10*3/uL (ref 0.1–1.0)
Monocytes Relative: 12 %
Neutro Abs: 3 10*3/uL (ref 1.7–7.7)
Neutrophils Relative %: 63 %
Platelets: 195 10*3/uL (ref 150–400)
RBC: 3.36 MIL/uL — ABNORMAL LOW (ref 4.22–5.81)
RDW: 15 % (ref 11.5–15.5)
WBC: 4.8 10*3/uL (ref 4.0–10.5)
nRBC: 0 % (ref 0.0–0.2)

## 2020-03-05 LAB — GLUCOSE, CAPILLARY
Glucose-Capillary: 112 mg/dL — ABNORMAL HIGH (ref 70–99)
Glucose-Capillary: 103 mg/dL — ABNORMAL HIGH (ref 70–99)
Glucose-Capillary: 96 mg/dL (ref 70–99)

## 2020-03-05 LAB — BASIC METABOLIC PANEL
Anion gap: 8 (ref 5–15)
BUN: 25 mg/dL — ABNORMAL HIGH (ref 8–23)
CO2: 29 mmol/L (ref 22–32)
Calcium: 9.1 mg/dL (ref 8.9–10.3)
Chloride: 102 mmol/L (ref 98–111)
Creatinine, Ser: 1.27 mg/dL — ABNORMAL HIGH (ref 0.61–1.24)
GFR calc Af Amer: >60 mL/min (ref >60)
GFR calc non Af Amer: 56 mL/min — ABNORMAL LOW (ref >60)
Glucose, Bld: 117 mg/dL — ABNORMAL HIGH (ref 70–99)
Potassium: 4.7 mmol/L (ref 3.5–5.1)
Sodium: 139 mmol/L (ref 135–145)

## 2020-03-05 LAB — BPAM RBC
Blood Product Expiration Date: 202107152359
ISSUE DATE / TIME: 202106191332
Unit Type and Rh: 5100

## 2020-03-05 MED ORDER — APIXABAN 5 MG PO TABS: 5.0000 mg | ORAL_TABLET | Freq: Two times a day (BID) | ORAL | Status: AC

## 2020-03-05 MED ORDER — PANTOPRAZOLE SODIUM 40 MG PO TBEC: 40.0000 mg | DELAYED_RELEASE_TABLET | Freq: Every day | ORAL | 0 refills | Status: AC

## 2020-03-05 NOTE — TOC Progression Note (Signed)
Transition of Care [facility]) - Progression Note    Patient Details  Name: Devin Holt MRN: [id number] Date of Birth: Sep 28, 1945  Transition of Care [facility]) CM/SW Contact  Carles Collet, RN Phone Number: 03/05/2020, 2:01 PM  Clinical Narrative:   Notified [facility] of DC today.     Expected Discharge Plan: [place] Barriers to Discharge: Continued Medical Work up  Expected Discharge Plan and Services Expected Discharge Plan: [place] In-house Referral: NA Discharge Planning Services: CM Consult Post Acute Care Choice: [place] arrangements for the past 2 months: Single Family Home Expected Discharge Date: 03/05/20               DME Arranged: N/A DME Agency: NA       HH Arranged: PT, RN [place] Agency: [facility] Date HH Agency Contacted: 02/28/20 Time [place]: [id number] Representative spoke with at [place]: [place] ([place]) Interventions    Readmission Risk Interventions No flowsheet data found.

## 2020-03-05 NOTE — Discharge Summary (Signed)
Physician Discharge Summary  BACH ROCCHI CXK:[id number] DOB: 03/19/1946 DOA: 02/24/2020  PCP: Bonnita Nasuti, MD  Admit date: 02/24/2020 Discharge date: 03/05/2020  Recommendations for Outpatient Follow-up:  1. Follow up with PCP in 7-10 days. 2. Have CBC drawn on that visit. 3. Follow up with gastroenterology as directed.   Follow-up Information    [facility] Follow up.   Specialty: [place] Why: the office will call to schedule home health visits Contact information: [address] [place] [id number] [phone]        Candee Furbish, MD Follow up on 04/06/2020.   Specialty: Pulmonary Disease Why: 4:00 pm Contact information: [place] [phone]                Discharge Diagnoses: Principal diagnosis is #1 1. Vent dependent respiratory failure 2. 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 Anemia 4. BPH 5. Urinary retention 6. Atrial fibrillation 7. CAD 8. DM II 9. CKD IIIa  Discharge Condition: Fair  Disposition: Home  Diet recommendation: Low residue/soft/modified carbohydrate  Filed Weights   02/28/20 0844 02/28/20 2002 02/29/20 0900  Weight: 76.1 kg 73.3 kg 78.5 kg    History of present illness:  Devin Holt is a 74 y.o. male with medical history significant of PAF, remote stroke, recently started on eliquis last month at [facility], DM2, COPD on nocturnal O2, CAD with prior stent, SSS s/p PPM.  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  Last EGD Jan 2020: cautery of small bowel AVMs.  Last colonoscopy Aug 2019.  These done in [place] by Dr. Lyda Jester.  Pt recently hospitalized at [facility] just last month for COPD exacerbation and a.fib RVR.  Started on eliquis during hospital stay.  Pt developing generalized weakness over past couple of weeks.  Has melanotic stool, though he didn't think the dark stool  unusual for him given h/o AVMs and PO iron treatment.  Went to PCP, HGB 5.5, sent in to ED at [facility], HGB 5.6.  2u PRBC transfusion and PPI bolus.  Transferred to [facility] Course:  Devin Holt with COPD, atrial fibrillation on anticoagulation, CAD, history of duodenal and colonic AVM, admitted with acute on chronic GI blood loss, hemoglobin 5.5. He decompensated 2 days post colonoscopy with hypercapnic respiratory failure require mechanical ventilation. He was extubated 6/16.  The patient was admitted to a telemetry bed. This morning his hemoglobin had dropped to 7.0. He will be transfused with one unit of PRBC's, his third.  The patient underwent EGD and colonoscopy by Dr. Cristina Gong. The colonoscopy revealed one 6 mm polyp in the transverse colon which was resected and retrieved. There were also three non-bl;eeding colonic angioectasias. These were treated with APC. EGD demonstrated angiodysplasia of the stomach and duodenum without bleeding.   This morning his hemoglobin was stable at 9.0. He was discharged to home in fair condition.  Today's assessment: S: The patient is resting comfortably. No new complaints. O: Vitals:  Vitals:   03/04/20 2058 03/05/20 0735  BP: (!) 123/51 126/63  Pulse: 61 71  Resp: 16 19  Temp: 98.6 F (37 C) 98.2 F (36.8 C)  SpO2: 95% 97%   Exam:  Constitutional:  . The patient is awake, alert, and oriented x 3. No acute distress. Respiratory:  . No increased work of breathing. . No wheezes, rales, or rhonchi .  No tactile fremitus Cardiovascular:  . Regular rate and rhythm . No murmurs, ectopy, or gallups. . No lateral PMI. No thrills. Abdomen:  . Abdomen is soft, non-tender, non-distended . No hernias, masses, or organomegaly . Normoactive bowel sounds.  Musculoskeletal:  . No cyanosis, clubbing, or edema Skin:  . No rashes, lesions, ulcers . palpation of skin: no induration or nodules Neurologic:  . CN 2-12 intact . Sensation all  4 extremities intact Psychiatric:  . Mental status o Mood, affect appropriate o Orientation to person, place, time  . judgment and insight appear intact   Discharge Instructions  Discharge Instructions    Activity as tolerated - No restrictions   Complete by: As directed    Call MD for:  difficulty breathing, headache or visual disturbances   Complete by: As directed    Call MD for:  extreme fatigue   Complete by: As directed    Call MD for:  persistant dizziness or light-headedness   Complete by: As directed    Diet Carb Modified   Complete by: As directed    Soft diet/Low residue   Discharge instructions   Complete by: As directed    Follow up with PCP in 7-10 days. Have CBC drawn on that visit. Follow up with gastroenterology as directed.   Increase activity slowly   Complete by: As directed      Allergies as of 03/05/2020      Reactions   Isosorbide Other (See Comments)   "Made me feel weak"      Medication List    STOP taking these medications   amiodarone 200 MG tablet Commonly known as: PACERONE   omeprazole 20 MG capsule Commonly known as: PRILOSEC Replaced by: pantoprazole 40 MG tablet     TAKE these medications   apixaban 5 MG Tabs tablet Commonly known as: Eliquis Take 1 tablet (5 mg total) by mouth 2 (two) times daily. Start taking on: March 07, 2020 What changed: These instructions start on March 07, 2020. If you are unsure what to do until then, ask your doctor or other care provider.   aspirin 81 MG EC tablet Take 81 mg by mouth daily.   diltiazem 240 MG 24 hr capsule Commonly known as: CARDIZEM CD Take 240 mg by mouth daily.   famotidine 20 MG tablet Commonly known as: PEPCID Take 20 mg by mouth 2 (two) times daily.   fenofibrate 54 MG tablet Take 54 mg by mouth daily.   levalbuterol 1.25 MG/3ML nebulizer solution Commonly known as: XOPENEX Take 1.25 mg by nebulization See admin instructions. Inhale contents of one vial (1.25 mg) via  nebulization every morning, may also use every 8 hours as needed for shortness of breath/wheezing   metFORMIN 500 MG tablet Commonly known as: GLUCOPHAGE Take 500 mg by mouth daily.   metoprolol tartrate 50 MG tablet Commonly known as: LOPRESSOR Take 50 mg by mouth 2 (two) times daily.   nitroGLYCERIN 0.3 MG SL tablet Commonly known as: NITROSTAT Place 0.3 mg under the tongue every 5 (five) minutes as needed for chest pain. Max of 3 tablets daily   OXYGEN Inhale 2 L into the lungs as needed (shortness of breath).   pantoprazole 40 MG tablet Commonly known as: PROTONIX Take 1 tablet (40 mg total) by mouth daily. Start taking on: March 06, 2020 Replaces: omeprazole 20 MG capsule   pravastatin 40 MG tablet Commonly known as: PRAVACHOL Take 40 mg by mouth daily.   tamsulosin 0.4 MG Caps  capsule Commonly known as: FLOMAX Take 0.4 mg by mouth daily.   Trelegy Ellipta 100-62.5-25 MCG/INH Aepb Generic drug: Fluticasone-Umeclidin-Vilant Inhale 1 puff into the lungs daily.      Allergies  Allergen Reactions  . Isosorbide Other (See Comments)    "Made me feel weak"    The results of significant diagnostics from this hospitalization (including imaging, microbiology, ancillary and laboratory) are listed below for reference.    Significant Diagnostic Studies: DG Abd 1 View  Result Date: 03/02/2020 CLINICAL DATA:  PillCam EXAM: ABDOMEN - 1 VIEW COMPARISON:  Chest x-ray 03/01/2020, abdominal radiograph 02/03/2020 FINDINGS: Small right pleural effusion and basilar airspace disease. Coronary calcification or stent. Round calcification in the right upper quadrant likely reflects gallstone. Aortoiliac stent graft. Phleboliths in the left pelvis. No unexpected radiopaque foreign bodies are visualized. Nonobstructed bowel-gas pattern. IMPRESSION: 1. Small right pleural effusion with basilar airspace disease. 2. No unexpected radiopaque foreign bodies. 3. Calcified gallstone in the right  upper quadrant. Electronically Signed   By: Donavan Foil M.D.   On: 03/02/2020 16:48   DG Chest Port 1 View  Result Date: 03/01/2020 CLINICAL DATA:  Respiratory failure. EXAM: PORTABLE CHEST 1 VIEW COMPARISON:  February 29, 2020. FINDINGS: Stable cardiomediastinal silhouette. Endotracheal and nasogastric tubes are unchanged in position. No pneumothorax is noted. Stable mild to moderate right pleural effusion is noted with right basilar atelectasis or infiltrate. Mild left basilar atelectasis or infiltrate is noted. Bony thorax is unremarkable. IMPRESSION: Stable support apparatus. Stable mild to moderate right pleural effusion is noted with right basilar atelectasis or infiltrate. Mild left basilar atelectasis or infiltrate is noted. Electronically Signed   By: Marijo Conception M.D.   On: 03/01/2020 08:23   DG Chest Port 1 View  Result Date: 02/29/2020 CLINICAL DATA:  73 year old male with history of respiratory failure. EXAM: PORTABLE CHEST 1 VIEW COMPARISON:  Chest x-ray 02/24/2020. FINDINGS: An endotracheal tube is in place with tip 3.3 cm above the carina. A nasogastric tube is seen extending into the stomach, however, the tip of the nasogastric tube extends below the lower margin of the image. Opacity at the right base may reflect atelectasis and/or consolidation with superimposed moderate right pleural effusion. Trace left pleural effusion. Patchy areas of interstitial prominence an ill-defined opacities are also noted throughout the right mid and upper lung, as well as the medial aspect of the left base. No pneumothorax. No evidence of pulmonary edema. Heart size is normal. Upper mediastinal contours are within normal limits. Aortic atherosclerosis. IMPRESSION: 1. Support apparatus, as above. 2. The appearance the chest suggests multilobar pneumonia, as above, with moderate right and trace left pleural effusions. 3. Aortic atherosclerosis. Electronically Signed   By: Vinnie Langton M.D.   On: 02/29/2020  09:37    Microbiology: Recent Results (from the past 240 hour(s))  MRSA PCR Screening     Status: None   Collection Time: 02/29/20  8:54 AM   Specimen: Nasal Mucosa; Nasopharyngeal  Result Value Ref Range Status   MRSA by PCR NEGATIVE NEGATIVE Final    Comment:        The GeneXpert MRSA Assay (FDA approved for NASAL specimens only), is one component of a comprehensive MRSA colonization surveillance program. It is not intended to diagnose MRSA infection nor to guide or monitor treatment for MRSA infections. Performed at [facility], [place] [address] [place], [place] [address]      Labs: Basic Metabolic Panel: Recent Labs  Lab 02/29/20 0543 02/29/20 3716  02/29/20 1497 02/29/20 1858 02/29/20 1957 02/29/20 2005 03/01/20 0344 03/04/20 0326 03/05/20 0702  NA 135   < > 134*   < > 133* 136 135 139 139  K 4.9   < > 5.3*   < > 4.4 4.5 4.5 4.6 4.7  CL 100  --  101  --   --   --  101 103 102  CO2 25  --  22  --   --   --  23 30 29   GLUCOSE 125*  --  161*  --   --   --  98 108* 117*  BUN 18  --  21  --   --   --  23 21 25*  CREATININE 1.79*  --  1.86*  --   --   --  1.64* 1.44* 1.27*  CALCIUM 8.9  --  8.5*  --   --   --  8.5* 8.9 9.1  MG  --   --  1.9  --   --   --  1.8  --   --   PHOS  --   --   --   --   --   --  3.8  --   --    < > = values in this interval not displayed.   Liver Function Tests: No results for input(s): AST, ALT, ALKPHOS, BILITOT, PROT, ALBUMIN in the last 168 hours. No results for input(s): LIPASE, AMYLASE in the last 168 hours. No results for input(s): AMMONIA in the last 168 hours. CBC: Recent Labs  Lab 02/29/20 0543 02/29/20 0856 02/29/20 0924 02/29/20 1858 02/29/20 2005 03/01/20 0344 03/04/20 0326 03/04/20 2045 03/05/20 0702  WBC 7.7  --  7.3  --   --  4.9 3.9*  --  4.8  NEUTROABS 5.9  --   --   --   --  3.4 2.3  --  3.0  HGB 8.0*   < > 8.7*   < > 7.8* 7.2* 7.0* 8.2* 9.0*  HCT 27.8*   < > 30.1*   < > 23.0* 24.0* 23.9* 27.5* 30.8*    MCV 93.3  --  94.4  --   --  90.9 91.9  --  91.7  PLT 250  --  211  --   --  172 169  --  195   < > = values in this interval not displayed.   Cardiac Enzymes: No results for input(s): CKTOTAL, CKMB, CKMBINDEX, TROPONINI in the last 168 hours. BNP: BNP (last 3 results) No results for input(s): BNP in the last 8760 hours.  ProBNP (last 3 results) No results for input(s): PROBNP in the last 8760 hours.  CBG: Recent Labs  Lab 03/04/20 2053 03/04/20 2334 03/05/20 0354 03/05/20 0728 03/05/20 1228  GLUCAP 154* 144* 112* 103* 96    Principal Problem:   Gastrointestinal hemorrhage with melena Active Problems:   PAF (paroxysmal atrial fibrillation) (HCC)   CAD (coronary artery disease)   COPD (chronic obstructive pulmonary disease) (HCC)   Chronic respiratory failure with hypoxia and hypercapnia (HCC)   DM2 (diabetes mellitus, type 2) (HCC)   Acute blood loss anemia   Angiodysplasia of stomach and duodenum with bleeding   AVM (arteriovenous malformation) of colon   Time coordinating discharge: 38 minutes.  Signed:        Adreyan Carbajal, DO [facility]  03/05/2020, 6:39 PM

## 2020-04-06 ENCOUNTER — Other Ambulatory Visit: Payer: Self-pay

## 2020-04-06 ENCOUNTER — Ambulatory Visit: Payer: Medicare HMO | Admitting: Adult Health

## 2020-04-06 ENCOUNTER — Inpatient Hospital Stay: Payer: Medicare HMO | Admitting: Internal Medicine

## 2020-04-06 ENCOUNTER — Encounter: Payer: Self-pay | Admitting: Adult Health

## 2020-04-06 VITALS — BP 118/60 | HR 79 | Temp 98.0°F | Ht 70.0 in | Wt 158.0 lb

## 2020-04-06 DIAGNOSIS — J9611 Chronic respiratory failure with hypoxia: Secondary | ICD-10-CM

## 2020-04-06 DIAGNOSIS — J9612 Chronic respiratory failure with hypercapnia: Secondary | ICD-10-CM | POA: Diagnosis not present

## 2020-04-06 DIAGNOSIS — J439 Emphysema, unspecified: Secondary | ICD-10-CM

## 2020-04-06 DIAGNOSIS — G936 Cerebral edema: Secondary | ICD-10-CM

## 2020-04-06 NOTE — Progress Notes (Signed)
@Patient  ID: Devin Holt, male    DOB: 07/31/1946, 74 y.o.   MRN: [id number]  Chief Complaint  Patient presents with  . Hospitalization Follow-up    Referring provider: Bonnita Nasuti, MD  HPI: 74 year old male with a known history of COPD, A. fib on anticoagulation, coronary artery disease, diabetes and history of duodenal AVMs and colonic AVMs was admitted to [facility] February 27, 2019 with severe weakness and found to have a GI bleed.  Hemoglobin on admission was 5.5.  He was transferred to [facility]..  He did undergo a colonoscopy with notable AVMs.  Patient had a acute respiratory distress with agonal breathing on March 01, 2019.  He did require intubation and ventilatory support.  Patient was seen by PCCM for consult.  Initial pH was 7.08 and PCO2 was 95.  TEST/EVENTS :   04/06/2020 Follow up : Post hospital follow-up. As above patient was seen for pulmonary critical care consult during hospitalization after he had severe respiratory distress and required intubation and ventilatory support.  He was initially admitted to [facility] February 27, 2019 with severe weakness found to have a GI bleed.  He was then transferred to [facility].  He has a history of AVMs. Patient had significant hypoxic and hypercarbic respiratory failure.  He was treated with ventilatory support and pulmonary hygiene.  He was able to be extubated and transition to oxygen.  Patient was placed on nocturnal CPAP -he was being treated for chronic COPD and hypercarbia.  Patient was seen by GI.  And underwent colonoscopy with notable AVMs.  His anticoagulation was held. Since discharge patient says he is feeling Patient carries a diagnosis of COPD.  Is on Trelegy daily.  He has Xopenex nebulizers to use as needed.  Has been admitted 3 times over the last 3 months and intubated x 3 for hypercarbic and hypoxic respiratory failure . See care everywhere notes.   01/03/20 ph 6.8 /Pco2 >98 , Hco3 23   02/02/20 ph 7.2/Pco2 67/hco3 27 02/29/20 ph 7.0, Pco2 95, Hco3 28   PCP has been managing his COPD . Been on oxygen for >31yrs . Very active until April 2021 but gets winded with minimal activity .  Uses oxygen 2l/m with activity and At bedtime  .  Previous work in Publishing rights manager  Receiving PT at home.      Allergies  Allergen Reactions  . Isosorbide Other (See Comments)    "Made me feel weak"    Immunization History  Administered Date(s) Administered  . Tdap 06/25/2018, 06/25/2018    Past Medical History:  Diagnosis Date  . A-fib ([place])   . Angiodysplasia of stomach and duodenum with bleeding   . AVM (arteriovenous malformation) of colon   . CAD (coronary artery disease)    LHC 01/07/20 - no significant stenosis  . CHF (congestive heart failure) (HCC)    EF - 55-60 as of May 2021 [facility]  . CVA (cerebral vascular accident) ([place])   . DM2 (diabetes mellitus, type 2) ([place])   . Emphysema of lung ([place])    Seen on CT  . GI bleed   . Iron deficiency anemia   . Myocardial infarct (HCC)     Tobacco History: Social History   Tobacco Use  Smoking Status Former Smoker  . Packs/day: 1.00  . Years: 23.00  . Pack years: 23.00  . Types: Cigarettes  . Quit date: 09/16/1993  . Years since quitting: 26.5  Smokeless Tobacco Never  Used   Counseling given: Not Answered   Outpatient Medications Prior to Visit  Medication Sig Dispense Refill  . apixaban (ELIQUIS) 5 MG TABS tablet Take 1 tablet (5 mg total) by mouth 2 (two) times daily. 60 tablet   . diltiazem (CARDIZEM CD) 240 MG 24 hr capsule Take 240 mg by mouth daily.    . famotidine (PEPCID) 20 MG tablet Take 20 mg by mouth 2 (two) times daily.    . fenofibrate 54 MG tablet Take 54 mg by mouth daily.    . Fluticasone-Umeclidin-Vilant (TRELEGY ELLIPTA) 100-62.5-25 MCG/INH AEPB Inhale 1 puff into the lungs daily.    Marland Kitchen levalbuterol (XOPENEX) 1.25 MG/3ML nebulizer solution Take 1.25 mg by nebulization See admin instructions. Inhale  contents of one vial (1.25 mg) via nebulization every morning, may also use every 8 hours as needed for shortness of breath/wheezing    . metFORMIN (GLUCOPHAGE) 500 MG tablet Take 500 mg by mouth daily.    . metoprolol tartrate (LOPRESSOR) 50 MG tablet Take 50 mg by mouth 2 (two) times daily.    . nitroGLYCERIN (NITROSTAT) 0.3 MG SL tablet Place 0.3 mg under the tongue every 5 (five) minutes as needed for chest pain. Max of 3 tablets daily    . OXYGEN Inhale 2 L into the lungs as needed (shortness of breath).    . pantoprazole (PROTONIX) 40 MG tablet Take 1 tablet (40 mg total) by mouth daily. 30 tablet 0  . pravastatin (PRAVACHOL) 40 MG tablet Take 40 mg by mouth daily.     . tamsulosin (FLOMAX) 0.4 MG CAPS capsule Take 0.4 mg by mouth daily.     No facility-administered medications prior to visit.     Review of Systems:   Constitutional:   No  weight loss, night sweats,  Fevers, chills, + fatigue, or  lassitude.  HEENT:   No headaches,  Difficulty swallowing,  Tooth/dental problems, or  Sore throat,                No sneezing, itching, ear ache, nasal congestion, post nasal drip,   CV:  No chest pain,  Orthopnea, PND, swelling in lower extremities, anasarca, dizziness, palpitations, syncope.   GI  No heartburn, indigestion, abdominal pain, nausea, vomiting, diarrhea, change in bowel habits, loss of appetite, bloody stools.   Resp:    No chest wall deformity  Skin: no rash or lesions.  GU: no dysuria, change in color of urine, no urgency or frequency.  No flank pain, no hematuria   MS:  No joint pain or swelling.  No decreased range of motion.  No back pain.    Physical Exam  BP (!) 118/60 (BP Location: Left Arm, Cuff Size: Normal)   Pulse 79   Temp 98 F (36.7 C) (Oral)   Ht 5\' 10"  (1.778 m)   Wt 158 lb (71.7 kg)   SpO2 95% Comment: on RA  BMI 22.67 kg/m   GEN: A/Ox3; pleasant , NAD, elderly ,   HEENT:  [place]/AT,    NOSE-clear, THROAT-clear, no lesions, no postnasal  drip or exudate noted.   NECK:  Supple w/ fair ROM; no JVD; normal carotid impulses w/o bruits; no thyromegaly or nodules palpated; no lymphadenopathy.    RESP  Clear  P & A; w/o, wheezes/ rales/ or rhonchi. no accessory muscle use, no dullness to percussion  CARD:  RRR, no m/r/g, no peripheral edema, pulses intact, no cyanosis or clubbing.  GI:   Soft & nt; nml bowel  sounds; no organomegaly or masses detected.   Musco: Warm bil, no deformities or joint swelling noted.   Neuro: alert, no focal deficits noted.    Skin: Warm, no lesions or rashes    Lab Results:  CBC  BNP No results found for: BNP  ProBNP No results found for: PROBNP  Imaging: No results found.    No flowsheet data found.  No results found for: NITRICOXIDE      Assessment & Plan:   COPD (chronic obstructive pulmonary disease) ([place]) Appears to have severe COPD.  As associated hypoxic and hypercarbic respiratory failure.  Needs PFTs on return.  Chest xray on return   Continue on current regimen with Trelegy.  Plan  Patient Instructions  Begin Trilogy device At bedtime  (Hypercarbia) Wear all night while sleeping and with naps.  Continue on TRELEGY daily , rinse after use.  Xopenex neb As needed  Wheezing .  Activity as tolerated.  Continue on Oxygen 2l/m with activity and At bedtime   Refer to pulmonary rehab in [place] .  Follow up in 4 weeks with Dr. Valeta Harms or Kerilyn Cortner NP and As needed with PFTs.   Please contact office for sooner follow up if symptoms do not improve or worsen or seek emergency care         Chronic respiratory failure with hypoxia and hypercapnia (HCC) Recurrent hypercarbic and hypoxic respiratory failure with intubation and ventilatory support x3 over the last 3 months.  It is quite obvious that patient needs noninvasive support at nighttime in order to improve his quality of life and prevention of recurrent hospitalizations and vent support.  Order for trilogy device to  be started at bedtime and with naps.  Plan  Patient Instructions  Begin Trilogy device At bedtime  (Hypercarbia) Wear all night while sleeping and with naps.  Continue on TRELEGY daily , rinse after use.  Xopenex neb As needed  Wheezing .  Activity as tolerated.  Continue on Oxygen 2l/m with activity and At bedtime   Refer to pulmonary rehab in [place] .  Follow up in 4 weeks with Dr. Valeta Harms or Tailyn Hantz NP and As needed with PFTs.   Please contact office for sooner follow up if symptoms do not improve or worsen or seek emergency care            Rexene Edison, NP 04/06/2020

## 2020-04-06 NOTE — Patient Instructions (Addendum)
Begin Trilogy device At bedtime  (Hypercarbia) Wear all night while sleeping and with naps.  Continue on TRELEGY daily , rinse after use.  Xopenex neb As needed  Wheezing .  Activity as tolerated.  Continue on Oxygen 2l/m with activity and At bedtime   Refer to pulmonary rehab in [place] .  Follow up in 4 weeks with Dr. Valeta Harms or Rondell Pardon NP and As needed with PFTs and chest xray   Please contact office for sooner follow up if symptoms do not improve or worsen or seek emergency care

## 2020-04-06 NOTE — Assessment & Plan Note (Signed)
Recurrent hypercarbic and hypoxic respiratory failure with intubation and ventilatory support x3 over the last 3 months.  It is quite obvious that patient needs noninvasive support at nighttime in order to improve his quality of life and prevention of recurrent hospitalizations and vent support.  Order for trilogy device to be started at bedtime and with naps.  Plan  Patient Instructions  Begin Trilogy device At bedtime  (Hypercarbia) Wear all night while sleeping and with naps.  Continue on TRELEGY daily , rinse after use.  Xopenex neb As needed  Wheezing .  Activity as tolerated.  Continue on Oxygen 2l/m with activity and At bedtime   Refer to pulmonary rehab in [place] .  Follow up in 4 weeks with Dr. Valeta Harms or Tim Wilhide NP and As needed with PFTs.   Please contact office for sooner follow up if symptoms do not improve or worsen or seek emergency care

## 2020-04-06 NOTE — Assessment & Plan Note (Addendum)
Appears to have severe COPD.  As associated hypoxic and hypercarbic respiratory failure.  Needs PFTs on return.  Chest xray on return   Continue on current regimen with Trelegy.  Plan  Patient Instructions  Begin Trilogy device At bedtime  (Hypercarbia) Wear all night while sleeping and with naps.  Continue on TRELEGY daily , rinse after use.  Xopenex neb As needed  Wheezing .  Activity as tolerated.  Continue on Oxygen 2l/m with activity and At bedtime   Refer to pulmonary rehab in [place] .  Follow up in 4 weeks with Dr. Valeta Harms or Margerie Fraiser NP and As needed with PFTs.   Please contact office for sooner follow up if symptoms do not improve or worsen or seek emergency care

## 2020-04-20 ENCOUNTER — Telehealth: Payer: Self-pay | Admitting: Adult Health

## 2020-04-20 NOTE — Telephone Encounter (Signed)
Attempted to call patient, he was not at home, family ask that we call back later today.

## 2020-04-21 NOTE — Telephone Encounter (Signed)
Please advise. Thank you

## 2020-04-25 NOTE — Telephone Encounter (Signed)
I spoke to spouse.  Pt to call me back to discuss this further.  It will be about the same cost for O2 w/ any DME.  [employer] does offer a hardship program in which patient will need to fill out paperwork to apply & supporting financial documents.

## 2020-04-25 NOTE — Telephone Encounter (Signed)
The patient was not calling about the O2.  The patient was calling because there should have been a cpap order for him.  I do not see where an order was placed.

## 2020-04-25 NOTE — Telephone Encounter (Signed)
It was not a CPAP, it was a trilogy vent and I did place the order for this on 04/06/20.

## 2020-04-28 NOTE — Telephone Encounter (Signed)
F/U CM sent to [place] to see if they have spoken to the patient about the trilogy order yet.

## 2020-04-28 NOTE — Telephone Encounter (Signed)
Devin Holt sent to [facility] Counts, Gilda Yes we are waiting on him to complete the Financial Hardship papers so that we can get him approved at a lower amount for the equipment, we spoke with him yesterday and the forms weren't completed so we are picking up on Monday   Amanda

## 2020-05-08 ENCOUNTER — Other Ambulatory Visit: Payer: Self-pay

## 2020-05-08 ENCOUNTER — Ambulatory Visit (INDEPENDENT_AMBULATORY_CARE_PROVIDER_SITE_OTHER): Payer: Medicare HMO | Admitting: Pulmonary Disease

## 2020-05-08 ENCOUNTER — Encounter: Payer: Self-pay | Admitting: Adult Health

## 2020-05-08 ENCOUNTER — Ambulatory Visit: Payer: Medicare HMO | Admitting: Adult Health

## 2020-05-08 ENCOUNTER — Ambulatory Visit (INDEPENDENT_AMBULATORY_CARE_PROVIDER_SITE_OTHER): Payer: Medicare HMO

## 2020-05-08 VITALS — BP 122/60 | HR 76 | Temp 97.3°F | Ht 69.0 in | Wt 161.0 lb

## 2020-05-08 DIAGNOSIS — J439 Emphysema, unspecified: Secondary | ICD-10-CM

## 2020-05-08 DIAGNOSIS — J9612 Chronic respiratory failure with hypercapnia: Secondary | ICD-10-CM

## 2020-05-08 DIAGNOSIS — J9611 Chronic respiratory failure with hypoxia: Secondary | ICD-10-CM | POA: Diagnosis not present

## 2020-05-08 LAB — PULMONARY FUNCTION TEST
DL/VA % pred: 74 %
DL/VA: 2.98 ml/min/mmHg/L
DLCO cor % pred: 49 %
DLCO cor: 12.14 ml/min/mmHg
DLCO unc % pred: 49 %
DLCO unc: 12.14 ml/min/mmHg
FEF 25-75 Post: 0.69 L/sec
FEF 25-75 Pre: 0.36 L/sec
FEF2575-%Change-Post: 95 %
FEF2575-%Pred-Post: 31 %
FEF2575-%Pred-Pre: 15 %
FEV1-%Change-Post: 32 %
FEV1-%Pred-Post: 39 %
FEV1-%Pred-Pre: 29 %
FEV1-Post: 1.19 L
FEV1-Pre: 0.9 L
FEV1FVC-%Change-Post: 3 %
FEV1FVC-%Pred-Pre: 63 %
FEV6-%Change-Post: 25 %
FEV6-%Pred-Post: 60 %
FEV6-%Pred-Pre: 47 %
FEV6-Post: 2.34 L
FEV6-Pre: 1.86 L
FEV6FVC-%Change-Post: -1 %
FEV6FVC-%Pred-Post: 100 %
FEV6FVC-%Pred-Pre: 102 %
FVC-%Change-Post: 27 %
FVC-%Pred-Post: 59 %
FVC-%Pred-Pre: 46 %
FVC-Post: 2.48 L
FVC-Pre: 1.94 L
Post FEV1/FVC ratio: 48 %
Post FEV6/FVC ratio: 94 %
Pre FEV1/FVC ratio: 46 %
Pre FEV6/FVC Ratio: 96 %

## 2020-05-08 NOTE — Progress Notes (Signed)
@Patient  ID: Devin Holt, male    DOB: 11-Mar-1946, 74 y.o.   MRN: [id number]  Chief Complaint  Patient presents with  . Follow-up    COPD     Referring provider: [facility] *  HPI: 74 year old male with a known history of COPD, A. fib on anticoagulation, coronary disease, diabetes, history of duodenal AVMs and chronic AVMs.  Seen during hospitalization June 2021 during critical illness where he was admitted for GI bleed with hemoglobin at 5.5 on admission found to have AVMs.  Patient had acute respiratory distress with hypercarbic respiratory failure requiring intubation and ventilatory support.  Initial pH 7.08 and PCO2 95.   TEST/EVENTS :   05/08/2020 Follow up : COPD , O2 RF  Patient returns for a 1 month follow-up.  Patient was seen last visit for a post hospital follow-up.  He had been seen in June of this year for hospital consult during acute hypercarbic and hypoxic respiratory failure.  He was admitted with GI bleed required colonoscopy.  Patient had acute respiratory distress.  Required intubation.  With elevated PCO2 at 95.  Patient was extubated and placed on nocturnal CPAP.  Patient has underlying chronic COPD and has been on oxygen for greater than 4 years.  Was followed by his primary care provider for his COPD.  Patient says he was very active up until about April 2021.  He uses oxygen with activity on occasion.  And remains on oxygen 2 L at bedtime.  Last visit patient was referred to pulmonary rehab which he says he is doing very well and enjoys it quite a bit and feels it is helping quite a bit. Patient has had several hospitalizations over the last few months and was intubated x3 for hypercarbic and hypoxic respiratory failure.  He was recommended for nocturnal trilogy last visit.  Unfortunately he has not been able to get this because insurance copayment is too high.  Hardship paperwork has been completed.  Trilogy orders are pending. Patient says since last  visit he is doing well.  He is become very active says he is not needing his oxygen very much during the daytime.  In a pulmonary rehab does not use it when he is exercising O2 saturations have been remaining above 90%.  He does use oxygen 2 L at bedtime.  He remains on Trelegy inhaler.  He denies any increased cough or wheezing.  Says he is doing yard work he just takes this time and rest frequently.  Patient did have pulmonary function testing which was done today and showed very severe COPD with an FEV1 at 39%, ratio 48, FVC 59%, positive bronchodilator response (32% change), DLCO 49%.   Allergies  Allergen Reactions  . Isosorbide Other (See Comments)    "Made me feel weak"    Immunization History  Administered Date(s) Administered  . Tdap 06/25/2018, 06/25/2018    Past Medical History:  Diagnosis Date  . A-fib ([place])   . Angiodysplasia of stomach and duodenum with bleeding   . AVM (arteriovenous malformation) of colon   . CAD (coronary artery disease)    LHC 01/07/20 - no significant stenosis  . CHF (congestive heart failure) (HCC)    EF - 55-60 as of May 2021 [facility]  . CVA (cerebral vascular accident) ([place])   . DM2 (diabetes mellitus, type 2) ([place])   . Emphysema of lung ([place])    Seen on CT  . GI bleed   . Iron deficiency anemia   .  Myocardial infarct (HCC)     Tobacco History: Social History   Tobacco Use  Smoking Status Former Smoker  . Packs/day: 1.00  . Years: 23.00  . Pack years: 23.00  . Types: Cigarettes  . Quit date: 09/16/1993  . Years since quitting: 26.6  Smokeless Tobacco Never Used   Counseling given: Not Answered   Outpatient Medications Prior to Visit  Medication Sig Dispense Refill  . apixaban (ELIQUIS) 5 MG TABS tablet Take 1 tablet (5 mg total) by mouth 2 (two) times daily. 60 tablet   . diltiazem (CARDIZEM CD) 240 MG 24 hr capsule Take 240 mg by mouth daily.    . famotidine (PEPCID) 20 MG tablet Take 20 mg by mouth 2 (two) times daily.    .  fenofibrate 54 MG tablet Take 54 mg by mouth daily.    . Fluticasone-Umeclidin-Vilant (TRELEGY ELLIPTA) 100-62.5-25 MCG/INH AEPB Inhale 1 puff into the lungs daily.    Marland Kitchen levalbuterol (XOPENEX) 1.25 MG/3ML nebulizer solution Take 1.25 mg by nebulization See admin instructions. Inhale contents of one vial (1.25 mg) via nebulization every morning, may also use every 8 hours as needed for shortness of breath/wheezing    . metFORMIN (GLUCOPHAGE) 500 MG tablet Take 500 mg by mouth daily.    . metoprolol tartrate (LOPRESSOR) 50 MG tablet Take 50 mg by mouth 2 (two) times daily.    . nitroGLYCERIN (NITROSTAT) 0.3 MG SL tablet Place 0.3 mg under the tongue every 5 (five) minutes as needed for chest pain. Max of 3 tablets daily    . OXYGEN Inhale 2 L into the lungs as needed (shortness of breath).    . pantoprazole (PROTONIX) 40 MG tablet Take 1 tablet (40 mg total) by mouth daily. 30 tablet 0  . pravastatin (PRAVACHOL) 40 MG tablet Take 40 mg by mouth daily.     . tamsulosin (FLOMAX) 0.4 MG CAPS capsule Take 0.4 mg by mouth daily.     No facility-administered medications prior to visit.     Review of Systems:   Constitutional:   No  weight loss, night sweats,  Fevers, chills,  +fatigue, or  lassitude.  HEENT:   No headaches,  Difficulty swallowing,  Tooth/dental problems, or  Sore throat,                No sneezing, itching, ear ache, nasal congestion, post nasal drip,   CV:  No chest pain,  Orthopnea, PND, swelling in lower extremities, anasarca, dizziness, palpitations, syncope.   GI  No heartburn, indigestion, abdominal pain, nausea, vomiting, diarrhea, change in bowel habits, loss of appetite, bloody stools.   Resp:    No chest wall deformity  Skin: no rash or lesions.  GU: no dysuria, change in color of urine, no urgency or frequency.  No flank pain, no hematuria   MS:  No joint pain or swelling.  No decreased range of motion.  No back pain.    Physical Exam  BP 122/60 (BP  Location: Left Arm, Cuff Size: Normal)   Pulse 76   Temp (!) 97.3 F (36.3 C) (Other (Comment)) Comment (Src): Wrist  Ht 5\' 9"  (1.753 m)   Wt 161 lb (73 kg)   SpO2 93% Comment: Room air  BMI 23.78 kg/m   GEN: A/Ox3; pleasant , NAD    HEENT:  [place]/AT,    NOSE-clear, THROAT-clear, no lesions, no postnasal drip or exudate noted.   NECK:  Supple w/ fair ROM; no JVD; normal carotid impulses w/o bruits;  no thyromegaly or nodules palpated; no lymphadenopathy.    RESP  Clear  P & A; w/o, wheezes/ rales/ or rhonchi. no accessory muscle use, no dullness to percussion  CARD:  RRR, no m/r/g, tr  peripheral edema, pulses intact, no cyanosis or clubbing.  GI:   Soft & nt; nml bowel sounds; no organomegaly or masses detected.   Musco: Warm bil, no deformities or joint swelling noted.   Neuro: alert, no focal deficits noted.    Skin: Warm, no lesions or rashes    Lab Results:   BNP No results found for: BNP  ProBNP No results found for: PROBNP  Imaging: No results found.    PFT Results Latest Ref Rng & Units 05/08/2020  FVC-Pre L 1.94  FVC-Predicted Pre % 46  FVC-Post L 2.48  FVC-Predicted Post % 59  Pre FEV1/FVC % % 46  Post FEV1/FCV % % 48  FEV1-Pre L 0.90  FEV1-Predicted Pre % 29  FEV1-Post L 1.19  DLCO uncorrected ml/min/mmHg 12.14  DLCO UNC% % 49  DLCO corrected ml/min/mmHg 12.14  DLCO COR %Predicted % 49  DLVA Predicted % 74    No results found for: NITRICOXIDE      Assessment & Plan:   COPD (chronic obstructive pulmonary disease) (HCC) Gold D/stage IV COPD (Pre-Spiro FEV1 29%) Patient is doing well since last visit.  No flare.  Pulmonary rehab is doing well.  Patient has significant hospitalizations and high risk for decompensations with multiple admissions and intubations.  Would benefit from nocturnal noninvasive vent support.  For underlying hypercarbic respiratory failure and severe COPD  Plan  Patient Instructions  Continue on TRELEGY daily , rinse  after use.  Xopenex neb As needed  Wheezing .  Activity as tolerated.  Continue on Oxygen 2l/m with activity and At bedtime   Continue with Pulmonary rehab in [place] .  Begin Trilogy device At bedtime  (Hypercarbia) Wear all night while sleeping and with naps. -when available.  Chest xray today  Follow up in 2-3 months with Dr. Valeta Harms or Kemaria Dedic NP and As needed    Please contact office for sooner follow up if symptoms do not improve or worsen or seek emergency care         Chronic respiratory failure with hypoxia and hypercapnia ([place]) Continue on oxygen 2 L at bedtime.  And use oxygen during the daytime with activity as needed to keep O2 saturations greater than 88 to 90%.  Nocturnal trilogy device is pending.  Plan  . Patient Instructions  Continue on TRELEGY daily , rinse after use.  Xopenex neb As needed  Wheezing .  Activity as tolerated.  Continue on Oxygen 2l/m with activity and At bedtime   Continue with Pulmonary rehab in [place] .  Begin Trilogy device At bedtime  (Hypercarbia) Wear all night while sleeping and with naps. -when available.  Chest xray today  Follow up in 2-3 months with Dr. Valeta Harms or Gao Mitnick NP and As needed    Please contact office for sooner follow up if symptoms do not improve or worsen or seek emergency care            Rexene Edison, NP 05/08/2020

## 2020-05-08 NOTE — Patient Instructions (Addendum)
Continue on TRELEGY daily , rinse after use.  Xopenex neb As needed  Wheezing .  Activity as tolerated.  Continue on Oxygen 2l/m with activity and At bedtime   Continue with Pulmonary rehab in [place] .  Begin Trilogy device At bedtime  (Hypercarbia) Wear all night while sleeping and with naps. -when available.  Chest xray today  Follow up in 2-3 months with Dr. Valeta Harms or Riyah Bardon NP and As needed    Please contact office for sooner follow up if symptoms do not improve or worsen or seek emergency care

## 2020-05-08 NOTE — Assessment & Plan Note (Signed)
Continue on oxygen 2 L at bedtime.  And use oxygen during the daytime with activity as needed to keep O2 saturations greater than 88 to 90%.  Nocturnal trilogy device is pending.  Plan  . Patient Instructions  Continue on TRELEGY daily , rinse after use.  Xopenex neb As needed  Wheezing .  Activity as tolerated.  Continue on Oxygen 2l/m with activity and At bedtime   Continue with Pulmonary rehab in [place] .  Begin Trilogy device At bedtime  (Hypercarbia) Wear all night while sleeping and with naps. -when available.  Chest xray today  Follow up in 2-3 months with Dr. Valeta Harms or Shakerra Red NP and As needed    Please contact office for sooner follow up if symptoms do not improve or worsen or seek emergency care

## 2020-05-08 NOTE — Progress Notes (Signed)
Thanks for seeing him  Garner Nash, DO [facility] 05/08/2020 6:49 PM

## 2020-05-08 NOTE — Progress Notes (Signed)
Full PFT performed today. °

## 2020-05-08 NOTE — Assessment & Plan Note (Signed)
Gold D/stage IV COPD (Pre-Spiro FEV1 29%) Patient is doing well since last visit.  No flare.  Pulmonary rehab is doing well.  Patient has significant hospitalizations and high risk for decompensations with multiple admissions and intubations.  Would benefit from nocturnal noninvasive vent support.  For underlying hypercarbic respiratory failure and severe COPD  Plan  Patient Instructions  Continue on TRELEGY daily , rinse after use.  Xopenex neb As needed  Wheezing .  Activity as tolerated.  Continue on Oxygen 2l/m with activity and At bedtime   Continue with Pulmonary rehab in [place] .  Begin Trilogy device At bedtime  (Hypercarbia) Wear all night while sleeping and with naps. -when available.  Chest xray today  Follow up in 2-3 months with Dr. Valeta Harms or Irfan Veal NP and As needed    Please contact office for sooner follow up if symptoms do not improve or worsen or seek emergency care

## 2020-05-09 ENCOUNTER — Other Ambulatory Visit: Payer: Self-pay | Admitting: Adult Health

## 2020-05-09 DIAGNOSIS — R911 Solitary pulmonary nodule: Secondary | ICD-10-CM

## 2020-05-23 ENCOUNTER — Telehealth: Payer: Self-pay | Admitting: Pulmonary Disease

## 2020-05-23 NOTE — Telephone Encounter (Signed)
[name] [phone]  Devin Holt    pt has declined the setup and makes to much money for hardship assistance and pt stated if he has to pay any money he does not want to do it. please cancel non invasive ventilator order   Tammy and Dr. Valeta Harms just [facility]

## 2020-08-02 ENCOUNTER — Ambulatory Visit: Payer: Medicare HMO | Admitting: Pulmonary Disease

## 2020-08-02 ENCOUNTER — Other Ambulatory Visit: Payer: Self-pay

## 2020-08-02 ENCOUNTER — Encounter: Payer: Self-pay | Admitting: Pulmonary Disease

## 2020-08-02 VITALS — BP 128/60 | HR 72 | Temp 97.8°F | Wt 171.2 lb

## 2020-08-02 DIAGNOSIS — J9612 Chronic respiratory failure with hypercapnia: Secondary | ICD-10-CM | POA: Diagnosis not present

## 2020-08-02 DIAGNOSIS — J449 Chronic obstructive pulmonary disease, unspecified: Secondary | ICD-10-CM

## 2020-08-02 DIAGNOSIS — R911 Solitary pulmonary nodule: Secondary | ICD-10-CM

## 2020-08-02 DIAGNOSIS — J432 Centrilobular emphysema: Secondary | ICD-10-CM

## 2020-08-02 MED ORDER — LEVALBUTEROL HCL 1.25 MG/3ML IN NEBU: 1.2500 mg | INHALATION_SOLUTION | RESPIRATORY_TRACT | 11 refills | Status: AC

## 2020-08-02 MED ORDER — TRELEGY ELLIPTA 100-62.5-25 MCG/INH IN AEPB: 1.0000 | INHALATION_SPRAY | Freq: Every day | RESPIRATORY_TRACT | 6 refills | Status: AC

## 2020-08-02 MED ORDER — TRELEGY ELLIPTA 200-62.5-25 MCG/INH IN AEPB: 1.0000 | INHALATION_SPRAY | Freq: Every day | RESPIRATORY_TRACT | 0 refills | Status: AC

## 2020-08-02 NOTE — Patient Instructions (Signed)
Thank you for visiting Dr. Valeta Harms at [facility]. Today we recommend the following:  Orders Placed This Encounter  Procedures  . CT Chest Wo Contrast   Meds ordered this encounter  Medications  . Fluticasone-Umeclidin-Vilant (TRELEGY ELLIPTA) 100-62.5-25 MCG/INH AEPB    Sig: Inhale 1 puff into the lungs daily.    Dispense:  1 each    Refill:  6  . levalbuterol (XOPENEX) 1.25 MG/3ML nebulizer solution    Sig: Take 1.25 mg by nebulization See admin instructions. Inhale contents of one vial (1.25 mg) via nebulization every morning, may also use every 8 hours as needed for shortness of breath/wheezing    Dispense:  72 mL    Refill:  11   Samples of trelegy today and [employer] paperwork to help with cost.  Maybe able to re-enroll in pulmonary rehab for new insurance year. Just let us know.   Return in about 6 months (around 01/30/2021) for Dr. Valeta Harms .    Please do your part to reduce the spread of COVID-19.

## 2020-08-02 NOTE — Progress Notes (Signed)
Synopsis: Referred in November 2021 for COPD by [place] *  Subjective:   PATIENT ID: Devin Holt GENDER: male DOB: 02/15/1946, MRN: [id number]  Chief Complaint  Patient presents with  . Follow-up    started with a cough this morning.  he has not had a few good days recently.  he has been having to use his oxygen more often during the day    This is a 74 year old gentleman, history of congestive heart failure, coronary artery disease, AVM of the colon, A. Fib.  Here today for follow-up regarding COPD management.  Currently on Trelegy inhaler.  Doing well.  He still has shortness of breath as described below.  Patient last seen in our office 05/08/2020 by Patricia Nettle.  He had a June hospitalization admitted for GI bleeding secondary to AVM with a hemoglobin of 5.5.  Also found to be in acute hypercarbic respiratory failure with a pH of 7.08 and a PCO2 of 95.  Patient was intubated mechanical life support.  This was the first time I met Mr. Arnott.  Shortness of Breath This is a chronic problem. The current episode started more than 1 year ago. The problem occurs daily. The problem has been gradually worsening. Pertinent negatives include no abdominal pain, chest pain, fever, headaches, hemoptysis, leg swelling, orthopnea, PND, rash, sore throat, sputum production, vomiting or wheezing. The symptoms are aggravated by exercise and weather changes. He has tried steroid inhalers (trelegy helps ) for the symptoms. The treatment provided significant relief. His past medical history is significant for CAD, COPD and a heart failure.    Past Medical History:  Diagnosis Date  . A-fib ([place])   . Angiodysplasia of stomach and duodenum with bleeding   . AVM (arteriovenous malformation) of colon   . CAD (coronary artery disease)    LHC 01/07/20 - no significant stenosis  . CHF (congestive heart failure) (HCC)    EF - 55-60 as of May 2021 [facility]  . CVA (cerebral vascular accident) ([place])    . DM2 (diabetes mellitus, type 2) ([place])   . Emphysema of lung ([place])    Seen on CT  . GI bleed   . Iron deficiency anemia   . Myocardial infarct [facility])      Family History  Problem Relation Age of Onset  . Colon cancer Mother   . Hypertension Father   . Stroke Father   . Colon cancer Sister      Past Surgical History:  Procedure Laterality Date  . ANGIOPLASTY    . CARDIAC CATHETERIZATION    . COLONOSCOPY W/ POLYPECTOMY    . COLONOSCOPY WITH PROPOFOL N/A 02/27/2020   Procedure: COLONOSCOPY WITH PROPOFOL;  Surgeon: Ronald Lobo, MD;  Location: [place];  Service: Endoscopy;  Laterality: N/A;  . CORONARY STENT PLACEMENT     3-4  . ENTEROSCOPY N/A 02/26/2020   Procedure: ENTEROSCOPY;  Surgeon: Ronald Lobo, MD;  Location: [place];  Service: Endoscopy;  Laterality: N/A;  . GIVENS CAPSULE STUDY N/A 02/28/2020   Procedure: GIVENS CAPSULE STUDY;  Surgeon: Ronald Lobo, MD;  Location: [place];  Service: Endoscopy;  Laterality: N/A;  . HOT HEMOSTASIS N/A 02/26/2020   Procedure: HOT HEMOSTASIS (ARGON PLASMA COAGULATION/BICAP);  Surgeon: Ronald Lobo, MD;  Location: [facility] ENDOSCOPY;  Service: Endoscopy;  Laterality: N/A;  . HOT HEMOSTASIS N/A 02/27/2020   Procedure: HOT HEMOSTASIS (ARGON PLASMA COAGULATION/BICAP);  Surgeon: Ronald Lobo, MD;  [facility];  Service: Endoscopy;  Laterality: N/A;  . OTHER  SURGICAL HISTORY     2 aortic stents  . PACEMAKER IMPLANT    . POLYPECTOMY  02/27/2020   Procedure: POLYPECTOMY;  Surgeon: Ronald Lobo, MD;  Location: [facility] ENDOSCOPY;  Service: Endoscopy;;    Social History   Socioeconomic History  . Marital status: Married    Spouse name: Not on file  . Number of children: Not on file  . Years of education: Not on file  . Highest education level: Not on file  Occupational History  . Not on file  Tobacco Use  . Smoking status: Former Smoker    Packs/day: 1.00    Years: 23.00    Pack years: 23.00    Types:  Cigarettes    Quit date: 09/16/1993    Years since quitting: 26.8  . Smokeless tobacco: Never Used  Vaping Use  . Vaping Use: Never used  Substance and Sexual Activity  . Alcohol use: Never  . Drug use: Never  . Sexual activity: Not on file  Other Topics Concern  . Not on file  Social History Narrative  . Not on file   Social Determinants of Health   Financial Resource Strain:   . Difficulty of Paying Living Expenses: Not on file  Food Insecurity:   . Worried About Charity fundraiser in the Last Year: Not on file  . Ran Out of Food in the Last Year: Not on file  Transportation Needs:   . Lack of Transportation (Medical): Not on file  . Lack of Transportation (Non-Medical): Not on file  Physical Activity:   . Days of Exercise per Week: Not on file  . Minutes of Exercise per Session: Not on file  Stress:   . Feeling of Stress : Not on file  Social Connections:   . Frequency of Communication with Friends and Family: Not on file  . Frequency of Social Gatherings with Friends and Family: Not on file  . Attends Religious Services: Not on file  . Active Member of Clubs or Organizations: Not on file  . Attends Archivist Meetings: Not on file  . Marital Status: Not on file  Intimate Partner Violence:   . Fear of Current or Ex-Partner: Not on file  . Emotionally Abused: Not on file  . Physically Abused: Not on file  . Sexually Abused: Not on file     Allergies  Allergen Reactions  . Isosorbide Other (See Comments)    "Made me feel weak"     Outpatient Medications Prior to Visit  Medication Sig Dispense Refill  . apixaban (ELIQUIS) 5 MG TABS tablet Take 1 tablet (5 mg total) by mouth 2 (two) times daily. 60 tablet   . diltiazem (CARDIZEM CD) 240 MG 24 hr capsule Take 240 mg by mouth daily.    . famotidine (PEPCID) 20 MG tablet Take 20 mg by mouth 2 (two) times daily.    . fenofibrate 54 MG tablet Take 54 mg by mouth daily.    . metFORMIN (GLUCOPHAGE) 500 MG  tablet Take 500 mg by mouth daily.    . metoprolol tartrate (LOPRESSOR) 50 MG tablet Take 50 mg by mouth 2 (two) times daily.    . nitroGLYCERIN (NITROSTAT) 0.3 MG SL tablet Place 0.3 mg under the tongue every 5 (five) minutes as needed for chest pain. Max of 3 tablets daily    . OXYGEN Inhale 2 L into the lungs as needed (shortness of breath).    . pantoprazole (PROTONIX) 40 MG tablet Take 1  tablet (40 mg total) by mouth daily. 30 tablet 0  . pravastatin (PRAVACHOL) 40 MG tablet Take 40 mg by mouth daily.     . tamsulosin (FLOMAX) 0.4 MG CAPS capsule Take 0.4 mg by mouth daily.    . Fluticasone-Umeclidin-Vilant (TRELEGY ELLIPTA) 100-62.5-25 MCG/INH AEPB Inhale 1 puff into the lungs daily.    Marland Kitchen levalbuterol (XOPENEX) 1.25 MG/3ML nebulizer solution Take 1.25 mg by nebulization See admin instructions. Inhale contents of one vial (1.25 mg) via nebulization every morning, may also use every 8 hours as needed for shortness of breath/wheezing     No facility-administered medications prior to visit.    Review of Systems  Constitutional: Negative for chills, fever, malaise/fatigue and weight loss.  HENT: Negative for hearing loss, sore throat and tinnitus.   Eyes: Negative for blurred vision and double vision.  Respiratory: Positive for cough. Negative for hemoptysis, sputum production, shortness of breath, wheezing and stridor.   Cardiovascular: Negative for chest pain, palpitations, orthopnea, leg swelling and PND.  Gastrointestinal: Negative for abdominal pain, constipation, diarrhea, heartburn, nausea and vomiting.  Genitourinary: Negative for dysuria, hematuria and urgency.  Musculoskeletal: Negative for joint pain and myalgias.  Skin: Negative for itching and rash.  Neurological: Negative for dizziness, tingling, weakness and headaches.  Endo/Heme/Allergies: Negative for environmental allergies. Does not bruise/bleed easily.  Psychiatric/Behavioral: Negative for depression. The patient is not  nervous/anxious and does not have insomnia.   All other systems reviewed and are negative.    Objective:  Physical Exam Vitals reviewed.  Constitutional:      General: He is not in acute distress.    Appearance: He is well-developed.  HENT:     Head: Normocephalic and atraumatic.     Mouth/Throat:     Pharynx: No oropharyngeal exudate.  Eyes:     Conjunctiva/sclera: Conjunctivae normal.     Pupils: Pupils are equal, round, and reactive to light.  Neck:     Vascular: No JVD.     Trachea: No tracheal deviation.     Comments: Loss of supraclavicular fat Cardiovascular:     Rate and Rhythm: Normal rate and regular rhythm.     Heart sounds: S1 normal and S2 normal.     Comments: Distant heart tones Pulmonary:     Effort: No tachypnea or accessory muscle usage.     Breath sounds: No stridor. Decreased breath sounds (throughout all lung fields) and wheezing present. No rhonchi or rales.  Abdominal:     General: Bowel sounds are normal. There is no distension.     Palpations: Abdomen is soft.     Tenderness: There is no abdominal tenderness.  Musculoskeletal:        General: Deformity (muscle wasting ) present.  Skin:    General: Skin is warm and dry.     Capillary Refill: Capillary refill takes less than 2 seconds.     Findings: No rash.  Neurological:     Mental Status: He is alert and oriented to person, place, and time.  Psychiatric:        Behavior: Behavior normal.      Vitals:   08/02/20 0855  BP: 128/60  Pulse: 72  Temp: 97.8 F (36.6 C)  TempSrc: Tympanic  SpO2: 92%  Weight: 171 lb 4 oz (77.7 kg)   92% on RA BMI Readings from Last 3 Encounters:  08/02/20 25.29 kg/m  05/08/20 23.78 kg/m  04/06/20 22.67 kg/m   Wt Readings from Last 3 Encounters:  08/02/20 171 lb 4  oz (77.7 kg)  05/08/20 161 lb (73 kg)  04/06/20 158 lb (71.7 kg)     CBC    Component Value Date/Time   WBC 4.8 03/05/2020 0702   RBC 3.36 (L) 03/05/2020 0702   HGB 9.0 (L)  03/05/2020 0702   HCT 30.8 (L) 03/05/2020 0702   PLT 195 03/05/2020 0702   MCV 91.7 03/05/2020 0702   MCH 26.8 03/05/2020 0702   MCHC 29.2 (L) 03/05/2020 0702   RDW 15.0 03/05/2020 0702   LYMPHSABS 0.7 03/05/2020 0702   MONOABS 0.6 03/05/2020 0702   EOSABS 0.5 03/05/2020 0702   BASOSABS 0.1 03/05/2020 0702    Chest Imaging: CT 02/03/2020: Right middle lobe pulmonary nodule recommend 1 year follow-up.  I cannot see these images but there was report from a chest x-ray in August 2021 looks like the images in canopy.  I had trouble accessing to review today.  Pulmonary Functions Testing Results: PFT Results Latest Ref Rng & Units 05/08/2020  FVC-Pre L 1.94  FVC-Predicted Pre % 46  FVC-Post L 2.48  FVC-Predicted Post % 59  Pre FEV1/FVC % % 46  Post FEV1/FCV % % 48  FEV1-Pre L 0.90  FEV1-Predicted Pre % 29  FEV1-Post L 1.19  DLCO uncorrected ml/min/mmHg 12.14  DLCO UNC% % 49  DLCO corrected ml/min/mmHg 12.14  DLCO COR %Predicted % 49  DLVA Predicted % 74    FeNO:   Pathology:   Echocardiogram:   Heart Catheterization:     Assessment & Plan:     ICD-10-CM   1. Chronic respiratory failure with hypercapnia (HCC)  J96.12 CT Chest Wo Contrast  2. Centrilobular emphysema ([place])  J43.2 CT Chest Wo Contrast  3. Nodule of middle lobe of right lung  R91.1 CT Chest Wo Contrast  4. Stage 3 severe COPD by GOLD classification ([place])  J44.9 CT Chest Wo Contrast    Discussion:  74 year old gentleman with severe COPD currently managed with triple therapy inhaler, nocturnal hypoxemia.  He did complete 22 days of pulmonary rehabilitation.  He really enjoyed this.  Right now goes to the gym 3 times a week to walk on a treadmill.  Plan: Recommend continuation of his exercise routine. Continue use of his triple therapy inhaler. Continue use of short acting bronchodilators as needed as needed for shortness of breath or wheezing   Current Outpatient Medications:  .  apixaban (ELIQUIS) 5  MG TABS tablet, Take 1 tablet (5 mg total) by mouth 2 (two) times daily., Disp: 60 tablet, Rfl:  .  diltiazem (CARDIZEM CD) 240 MG 24 hr capsule, Take 240 mg by mouth daily., Disp: , Rfl:  .  famotidine (PEPCID) 20 MG tablet, Take 20 mg by mouth 2 (two) times daily., Disp: , Rfl:  .  fenofibrate 54 MG tablet, Take 54 mg by mouth daily., Disp: , Rfl:  .  Fluticasone-Umeclidin-Vilant (TRELEGY ELLIPTA) 100-62.5-25 MCG/INH AEPB, Inhale 1 puff into the lungs daily., Disp: 1 each, Rfl: 6 .  levalbuterol (XOPENEX) 1.25 MG/3ML nebulizer solution, Take 1.25 mg by nebulization See admin instructions. Inhale contents of one vial (1.25 mg) via nebulization every morning, may also use every 8 hours as needed for shortness of breath/wheezing, Disp: 72 mL, Rfl: 11 .  metFORMIN (GLUCOPHAGE) 500 MG tablet, Take 500 mg by mouth daily., Disp: , Rfl:  .  metoprolol tartrate (LOPRESSOR) 50 MG tablet, Take 50 mg by mouth 2 (two) times daily., Disp: , Rfl:  .  nitroGLYCERIN (NITROSTAT) 0.3 MG SL tablet, Place 0.3  mg under the tongue every 5 (five) minutes as needed for chest pain. Max of 3 tablets daily, Disp: , Rfl:  .  OXYGEN, Inhale 2 L into the lungs as needed (shortness of breath)., Disp: , Rfl:  .  pantoprazole (PROTONIX) 40 MG tablet, Take 1 tablet (40 mg total) by mouth daily., Disp: 30 tablet, Rfl: 0 .  pravastatin (PRAVACHOL) 40 MG tablet, Take 40 mg by mouth daily. , Disp: , Rfl:  .  tamsulosin (FLOMAX) 0.4 MG CAPS capsule, Take 0.4 mg by mouth daily., Disp: , Rfl:   I spent 30 minutes dedicated to the care of this patient on the date of this encounter to include pre-visit review of records, face-to-face time with the patient discussing conditions above, post visit ordering of testing, clinical documentation with the electronic health record, making appropriate referrals as documented, and communicating necessary findings to members of the patients care team.   Garner Nash, DO [facility] 08/02/2020 9:20 AM

## 2020-08-02 NOTE — Addendum Note (Signed)
Addended by: Elie Confer on: 08/02/2020 09:45 AM   Modules accepted: Orders

## 2020-10-17 DEATH — deceased

## 2020-12-04 IMAGING — DX DG CHEST 1V PORT
1 series · 2 of 2 positions shown · non-contrast
Comparison: Chest x-ray 02/24/2020.

CLINICAL DATA: 73-year-old male with history of respiratory
failure.

EXAM:
PORTABLE CHEST 1 VIEW

[Series 1: chest · 0.14mm/px · 2 of 2 slices shown]
[im 1/2]
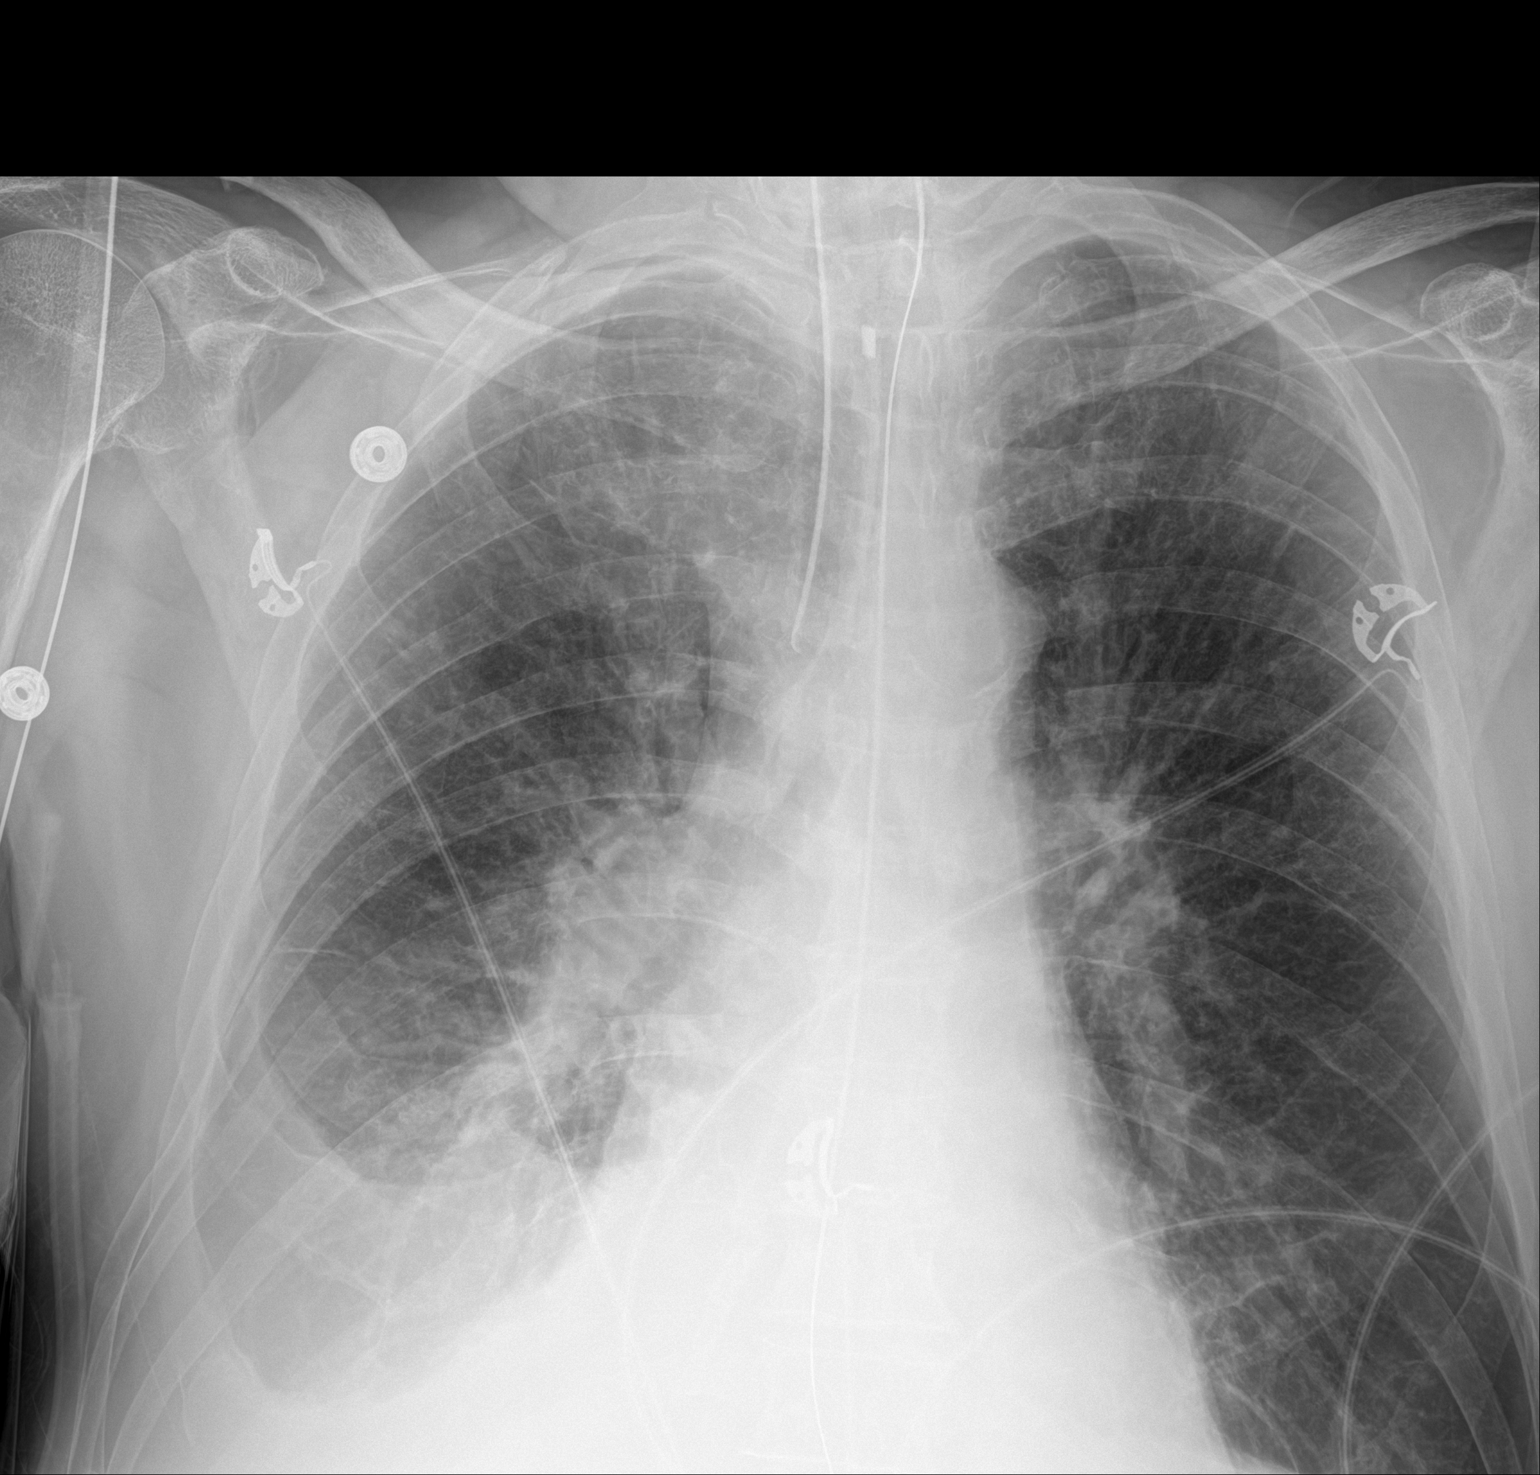
[im 2/2]
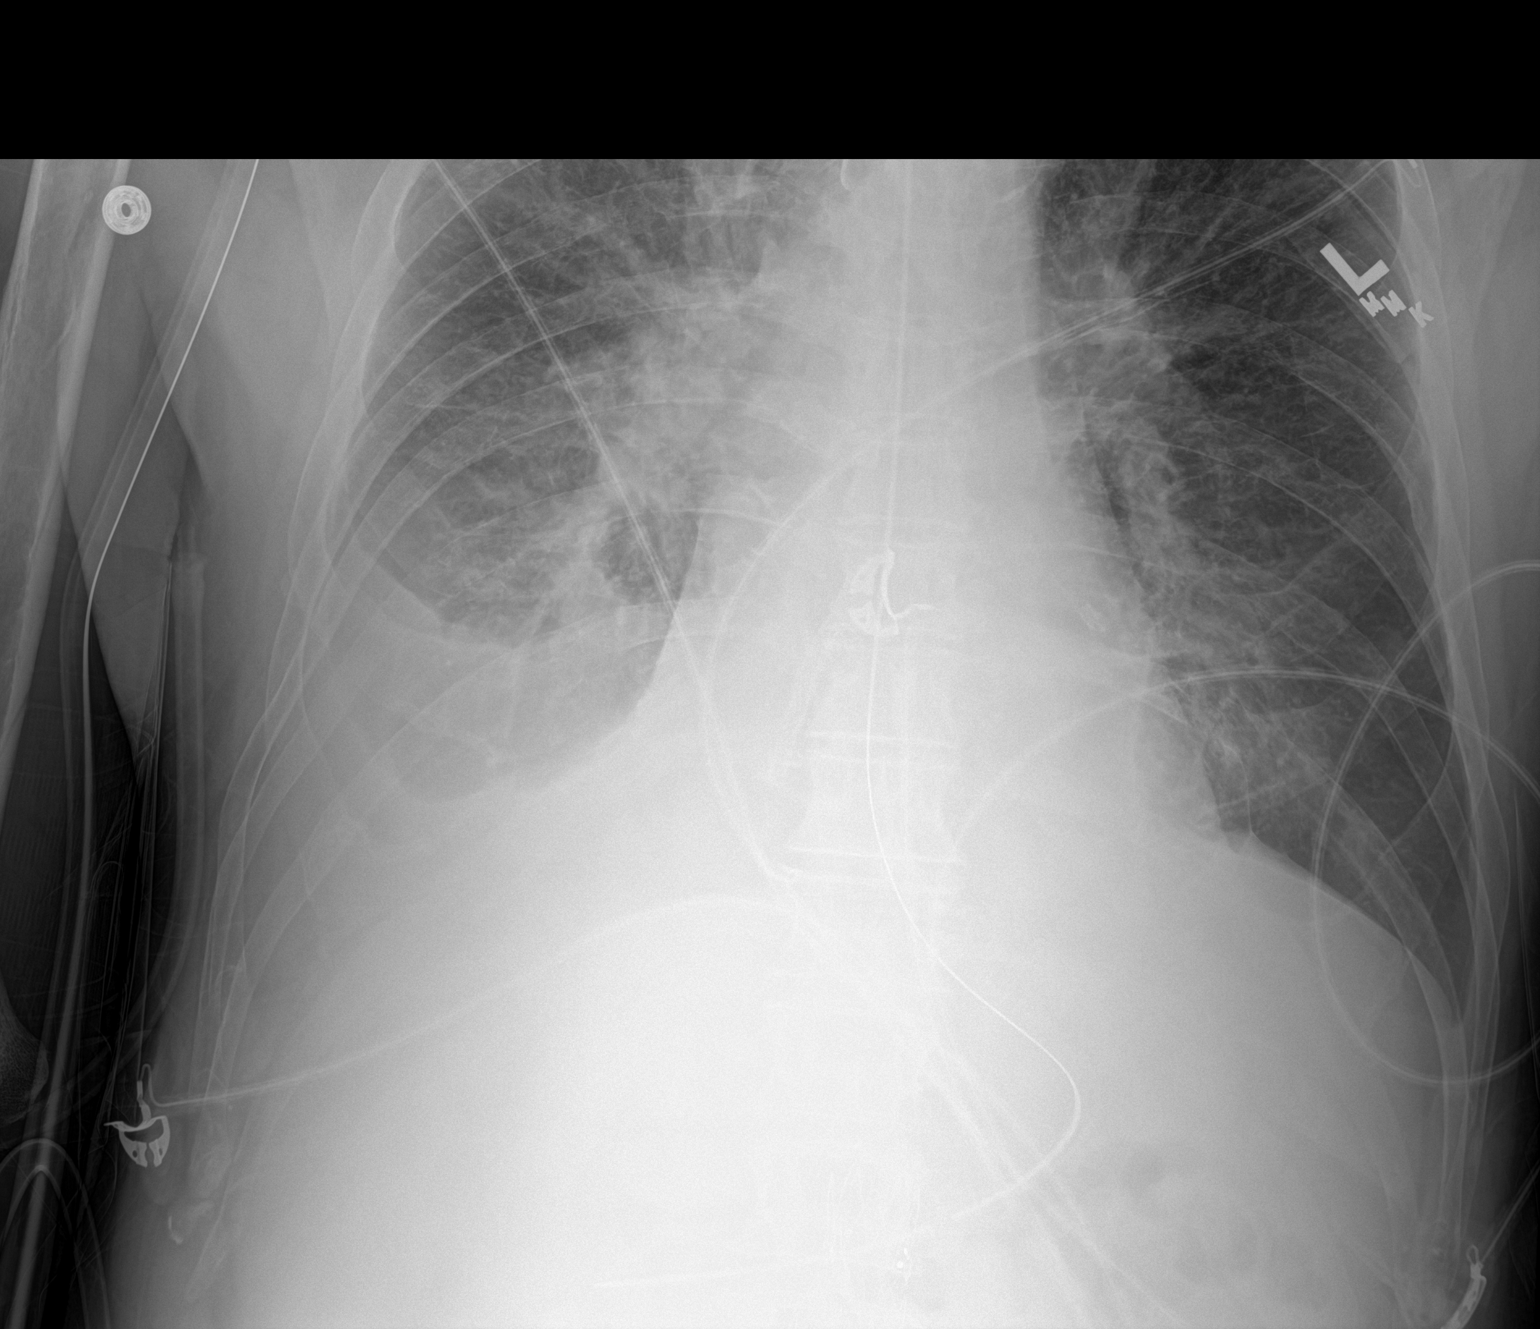

[2 of 2 positions shown; findings below may reference images not displayed]

FINDINGS: An endotracheal tube is in place with tip 3.3 cm above the carina. A
nasogastric tube is seen extending into the stomach, however, the
tip of the nasogastric tube extends below the lower margin of the
image. Opacity at the right base may reflect atelectasis and/or
consolidation with superimposed moderate right pleural effusion.
Trace left pleural effusion. Patchy areas of interstitial prominence
an ill-defined opacities are also noted throughout the right mid and
upper lung, as well as the medial aspect of the left base. No
pneumothorax. No evidence of pulmonary edema. Heart size is normal.
Upper mediastinal contours are within normal limits. Aortic
atherosclerosis.
IMPRESSION: 1. Support apparatus, as above.
2. The appearance the chest suggests multilobar pneumonia, as above,
with moderate right and trace left pleural effusions.
3. Aortic atherosclerosis.

## 2021-01-31 ENCOUNTER — Other Ambulatory Visit: Payer: Medicare HMO
